# Patient Record
Sex: Female | Born: 1965 | Race: White | Hispanic: No | Marital: Married | Smoking: Never smoker
Health system: Southern US, Community
[De-identification: ages and names within clinical notes are randomized; demographics above are authoritative.]

## PROBLEM LIST (undated history)

## (undated) DIAGNOSIS — E7800 Pure hypercholesterolemia, unspecified: Secondary | ICD-10-CM

## (undated) DIAGNOSIS — U071 COVID-19: Secondary | ICD-10-CM

## (undated) DIAGNOSIS — I10 Essential (primary) hypertension: Secondary | ICD-10-CM

## (undated) HISTORY — DX: Pure hypercholesterolemia, unspecified: E78.00

## (undated) HISTORY — DX: COVID-19: U07.1

## (undated) HISTORY — DX: Essential (primary) hypertension: I10

---

## 2007-05-24 ENCOUNTER — Other Ambulatory Visit: Admission: RE | Admit: 2007-05-24 | Discharge: 2007-05-24 | Payer: Self-pay | Admitting: Gynecology

## 2008-06-09 ENCOUNTER — Other Ambulatory Visit: Admission: RE | Admit: 2008-06-09 | Discharge: 2008-06-09 | Payer: Self-pay | Admitting: Gynecology

## 2009-06-10 ENCOUNTER — Other Ambulatory Visit: Admission: RE | Admit: 2009-06-10 | Discharge: 2009-06-10 | Payer: Self-pay | Admitting: Gynecology

## 2009-06-10 ENCOUNTER — Ambulatory Visit: Payer: Self-pay | Admitting: Gynecology

## 2009-06-10 ENCOUNTER — Encounter: Payer: Self-pay | Admitting: Gynecology

## 2009-06-25 ENCOUNTER — Ambulatory Visit: Payer: Self-pay | Admitting: Gynecology

## 2009-06-29 ENCOUNTER — Encounter: Admission: RE | Admit: 2009-06-29 | Discharge: 2009-06-29 | Payer: Self-pay | Admitting: Gynecology

## 2010-06-13 ENCOUNTER — Ambulatory Visit: Payer: Self-pay | Admitting: Gynecology

## 2010-06-13 ENCOUNTER — Other Ambulatory Visit: Admission: RE | Admit: 2010-06-13 | Discharge: 2010-06-13 | Payer: Self-pay | Admitting: Gynecology

## 2011-06-15 ENCOUNTER — Encounter: Payer: Self-pay | Admitting: Gynecology

## 2011-06-15 ENCOUNTER — Other Ambulatory Visit (HOSPITAL_COMMUNITY)
Admission: RE | Admit: 2011-06-15 | Discharge: 2011-06-15 | Disposition: A | Discharge status: Home / Self Care | Payer: 59 | Source: Ambulatory Visit | Attending: Gynecology | Admitting: Gynecology

## 2011-06-15 ENCOUNTER — Ambulatory Visit (INDEPENDENT_AMBULATORY_CARE_PROVIDER_SITE_OTHER): Payer: 59 | Admitting: Gynecology

## 2011-06-15 VITALS — BP 122/78 | Ht 65.0 in | Wt 166.0 lb

## 2011-06-15 DIAGNOSIS — Z01419 Encounter for gynecological examination (general) (routine) without abnormal findings: Secondary | ICD-10-CM | POA: Insufficient documentation

## 2011-06-15 DIAGNOSIS — Z3041 Encounter for surveillance of contraceptive pills: Secondary | ICD-10-CM

## 2011-06-15 DIAGNOSIS — Z131 Encounter for screening for diabetes mellitus: Secondary | ICD-10-CM

## 2011-06-15 DIAGNOSIS — Z13220 Encounter for screening for lipoid disorders: Secondary | ICD-10-CM

## 2011-06-15 DIAGNOSIS — R823 Hemoglobinuria: Secondary | ICD-10-CM

## 2011-06-15 MED ORDER — LEVONORG-ETH ESTRAD TRIPHASIC PO TABS: 1.0000 | ORAL_TABLET | Freq: Every day | ORAL | Status: DC

## 2011-06-15 NOTE — Progress Notes (Signed)
Mary Li April 03, 1966 [id number]        45 y.o.  for annual exam.  Doing well no complaints.  Past medical history,surgical history, medications, allergies, family history and social history were all reviewed and documented in the EPIC chart. ROS:  Was performed and pertinent positives and negatives are included in the history.  Exam: chaperone present Filed Vitals:   06/15/11 1449  BP: 122/78   General appearance  Normal Skin grossly normal Head/Neck normal with no cervical or supraclavicular adenopathy thyroid normal Lungs  clear Cardiac RR, without RMG Abdominal  soft, nontender, without masses, organomegaly or hernia Breasts  examined lying and sitting without masses, retractions, discharge or axillary adenopathy. Pelvic  Ext/BUS/vagina  normal   Cervix  normal  Pap done  Uterus  anteverted, normal size, shape and contour, midline and mobile nontender   Adnexa  Without masses or tenderness    Anus and perineum  normal   Rectovaginal  normal sphincter tone without palpated masses or tenderness.    Assessment/Plan:  45 y.o. female for annual exam.   Doing well no complaints. She is on Trivora BCPs and wants to continue. She does not smoke is not being followed for any medical issues such as blood pressure or diabetes. I again reviewed the risks to include stroke heart attack DVT all of which she understands and accepts wants to continue I refilled her times a year. Self breast exams on a monthly basis discussed encouraged. She is overdue for her mammography I strongly urged her to schedule this and she agrees to do so. Check baseline labs CBC glucose lipid profile and urinalysis. If significant he is well she'll see me in a year sooner as needed    Dara Lords MD, 3:21 PM 06/15/2011

## 2011-06-16 ENCOUNTER — Telehealth: Payer: Self-pay | Admitting: *Deleted

## 2011-06-16 DIAGNOSIS — E7800 Pure hypercholesterolemia, unspecified: Secondary | ICD-10-CM

## 2011-06-16 NOTE — Telephone Encounter (Signed)
PT INFORMED WITH THE BELOW NOTE. RECALL LETTER AND ORDER FOR FLP IN COMPUTER.

## 2011-06-16 NOTE — Telephone Encounter (Signed)
Message copied by Aura Camps on Fri Jun 16, 2011 11:27 AM ------      Message from: Dara Lords      Created: Fri Jun 16, 2011 11:14 AM       Recommend low-fat diet increased exercise and repeat fasting lipid profile in 6 months.

## 2011-06-17 ENCOUNTER — Other Ambulatory Visit: Payer: Self-pay | Admitting: Gynecology

## 2012-04-04 ENCOUNTER — Ambulatory Visit (INDEPENDENT_AMBULATORY_CARE_PROVIDER_SITE_OTHER): Payer: 59 | Admitting: Gynecology

## 2012-04-04 ENCOUNTER — Encounter: Payer: Self-pay | Admitting: Gynecology

## 2012-04-04 VITALS — BP 128/84

## 2012-04-04 DIAGNOSIS — N390 Urinary tract infection, site not specified: Secondary | ICD-10-CM

## 2012-04-04 DIAGNOSIS — R300 Dysuria: Secondary | ICD-10-CM

## 2012-04-04 LAB — URINALYSIS W MICROSCOPIC + REFLEX CULTURE
Ketones, ur: NEGATIVE mg/dL
Nitrite: NEGATIVE
Urobilinogen, UA: 0.2 mg/dL (ref 0.0–1.0)

## 2012-04-04 MED ORDER — PHENAZOPYRIDINE HCL 200 MG PO TABS: 200.0000 mg | ORAL_TABLET | Freq: Three times a day (TID) | ORAL | Status: AC | PRN

## 2012-04-04 MED ORDER — SULFAMETHOXAZOLE-TRIMETHOPRIM 800-160 MG PO TABS: 1.0000 | ORAL_TABLET | Freq: Two times a day (BID) | ORAL | Status: AC

## 2012-04-04 NOTE — Progress Notes (Signed)
Patient presents with two-day history of increasing frequency, dysuria, lower abdominal discomfort. No fever chills nausea vomiting diarrhea constipation or low back pain.  Exam Spine straight no CVA tenderness. Abdomen soft with mild suprapubic tenderness. No rebound guarding masses or organomegaly.  Assessment and plan: Exam and UA consistent with UTI. We'll treat with Septra DS 1 by mouth twice a day x3 days and Pyridium 200 mg 3 times a day x3 days. Follow up if symptoms persist or recur. Otherwise she will follow up in August when she is due for her annual.

## 2012-04-04 NOTE — Patient Instructions (Signed)
Take antibiotics twice daily for 3 days. Take peridium which will change the color of your urine 3 times daily. Follow up if your symptoms persist or recur. Otherwise follow up in August when you're due for your annual exam.

## 2012-04-07 LAB — URINE CULTURE: Colony Count: >=100000

## 2012-05-16 ENCOUNTER — Other Ambulatory Visit: Payer: Self-pay | Admitting: Gynecology

## 2012-05-20 ENCOUNTER — Other Ambulatory Visit: Payer: Self-pay | Admitting: Gynecology

## 2012-06-16 DIAGNOSIS — E7800 Pure hypercholesterolemia, unspecified: Secondary | ICD-10-CM

## 2012-06-16 HISTORY — DX: Pure hypercholesterolemia, unspecified: E78.00

## 2012-06-19 ENCOUNTER — Encounter: Payer: Self-pay | Admitting: Gynecology

## 2012-06-19 ENCOUNTER — Ambulatory Visit (INDEPENDENT_AMBULATORY_CARE_PROVIDER_SITE_OTHER): Payer: 59 | Admitting: Gynecology

## 2012-06-19 VITALS — BP 120/74 | Ht 65.0 in | Wt 170.0 lb

## 2012-06-19 DIAGNOSIS — Z13220 Encounter for screening for lipoid disorders: Secondary | ICD-10-CM

## 2012-06-19 DIAGNOSIS — Z131 Encounter for screening for diabetes mellitus: Secondary | ICD-10-CM

## 2012-06-19 DIAGNOSIS — Z01419 Encounter for gynecological examination (general) (routine) without abnormal findings: Secondary | ICD-10-CM

## 2012-06-19 LAB — LIPID PANEL
Cholesterol: 263 mg/dL — ABNORMAL HIGH (ref 0–200)
HDL: 49 mg/dL (ref >39)

## 2012-06-19 LAB — CBC WITH DIFFERENTIAL/PLATELET
Eosinophils Absolute: 0.1 10*3/uL (ref 0.0–0.7)
Lymphocytes Relative: 31 % (ref 12–46)
Lymphs Abs: 1.5 10*3/uL (ref 0.7–4.0)
Neutro Abs: 2.6 10*3/uL (ref 1.7–7.7)
Neutrophils Relative %: 53 % (ref 43–77)
Platelets: 257 10*3/uL (ref 150–400)
RBC: 4.83 MIL/uL (ref 3.87–5.11)
WBC: 4.8 10*3/uL (ref 4.0–10.5)

## 2012-06-19 LAB — GLUCOSE, RANDOM: Glucose, Bld: 103 mg/dL — ABNORMAL HIGH (ref 70–99)

## 2012-06-19 MED ORDER — LEVONORG-ETH ESTRAD TRIPHASIC PO TABS: 1.0000 | ORAL_TABLET | Freq: Every day | ORAL | Status: DC

## 2012-06-19 NOTE — Patient Instructions (Signed)
Follow up in one year for annual gynecologic exam. 

## 2012-06-19 NOTE — Progress Notes (Signed)
[facility] 28-Aug-1966 [id number]        46 y.o.  G0P0 for annual exam.  Doing well.  Past medical history,surgical history, medications, allergies, family history and social history were all reviewed and documented in the EPIC chart. ROS:  Was performed and pertinent positives and negatives are included in the history.  Exam: Sherrilyn Rist assistant Filed Vitals:   06/19/12 0859  BP: 120/74  Height: 5\' 5"  (1.651 m)  Weight: 170 lb (77.111 kg)   General appearance  Normal Skin grossly normal Head/Neck normal with no cervical or supraclavicular adenopathy thyroid normal Lungs  clear Cardiac RR, without RMG Abdominal  soft, nontender, without masses, organomegaly or hernia Breasts  examined lying and sitting without masses, retractions, discharge or axillary adenopathy. Pelvic  Ext/BUS/vagina  normal   Cervix  normal slight menses flow  Uterus  anteverted, normal size, shape and contour, midline and mobile nontender   Adnexa  Without masses or tenderness    Anus and perineum  normal   Rectovaginal  normal sphincter tone without palpated masses or tenderness.    Assessment/Plan:  46 y.o. G0P0 female for annual exam.   1. Contraception. Patient on oral contraceptives and wants to continue. She's having no problems. We discussed the risks to include stroke heart attack DVT. She has no medical issues does not smoke. I reviewed alternatives to encourage her to consider Mirena IUD. This is not interested at this point. I refilled her pills times a year. 2. Mammography. Patient's overdue for her mammogram and encouraged her to schedule this. SBE monthly reviewed. 3. Pap smear. Last Pap smear 2012. No Pap smear done today. Patient has no history of abnormal Pap smears with numerous normal records in her chart. We'll plan every 3-5 your screening. 4. Health maintenance. Patient had borderline cholesterol with elevated LDL last year and never repeated. We'll check baseline labs today. She is  fasting. CBC lipid profile glucose urinalysis ordered. Follow up one year, sooner as needed.    Dara Lords MD, 9:23 AM 06/19/2012

## 2012-06-19 NOTE — Addendum Note (Signed)
Addended by: Dara Lords on: 06/19/2012 10:54 AM   Modules accepted: Orders

## 2012-06-20 ENCOUNTER — Encounter: Payer: Self-pay | Admitting: Gynecology

## 2012-06-20 LAB — URINALYSIS W MICROSCOPIC + REFLEX CULTURE
Casts: NONE SEEN
Glucose, UA: NEGATIVE mg/dL
Leukocytes, UA: NEGATIVE
pH: 5.5 (ref 5.0–8.0)

## 2012-06-21 ENCOUNTER — Telehealth: Payer: Self-pay | Admitting: Gynecology

## 2012-06-21 MED ORDER — SULFAMETHOXAZOLE-TRIMETHOPRIM 800-160 MG PO TABS: 1.0000 | ORAL_TABLET | Freq: Two times a day (BID) | ORAL | Status: AC

## 2012-06-21 NOTE — Telephone Encounter (Signed)
Left message on pt voicemail to call 

## 2012-06-21 NOTE — Telephone Encounter (Signed)
Tell patient urine shows UTI. Recommend Septra DS 1 by mouth twice a day x3 days. Recommend follow up urine analysis after treatment in several weeks just to make sure the blood in her urine clears.

## 2012-06-22 LAB — URINE CULTURE: Colony Count: >=100000

## 2012-06-24 NOTE — Telephone Encounter (Signed)
Pt informed with the below note. # given to [place] to find PCP.

## 2012-06-24 NOTE — Telephone Encounter (Signed)
Pt informed regarding the below note. I gave her other lab work as well, she asked if her glucose should be recheck? She was fasting that day. Glucose was 103. Please advise

## 2012-06-24 NOTE — Telephone Encounter (Signed)
The patient definitely needs to follow up with her primary physician in reference to her elevated cholesterol and LDL. They will be rechecking this at some point and they can recheck a glucose at that point. Less than 100 is normal, 100-125 is considered prediabetic and 126 above his diabetes. So at 103 it is a marginal elevation and I wouldn't do anything else other than worry about her cholesterol this point.

## 2012-07-04 NOTE — Telephone Encounter (Signed)
Order expired the pt has had her annual this year already. KW

## 2013-05-19 ENCOUNTER — Other Ambulatory Visit: Payer: Self-pay | Admitting: Gynecology

## 2013-06-20 ENCOUNTER — Encounter: Payer: Self-pay | Admitting: Gynecology

## 2013-06-20 ENCOUNTER — Ambulatory Visit (INDEPENDENT_AMBULATORY_CARE_PROVIDER_SITE_OTHER): Payer: 59 | Admitting: Gynecology

## 2013-06-20 VITALS — BP 120/76 | Ht 66.0 in | Wt 174.0 lb

## 2013-06-20 DIAGNOSIS — Z01419 Encounter for gynecological examination (general) (routine) without abnormal findings: Secondary | ICD-10-CM

## 2013-06-20 DIAGNOSIS — Z13220 Encounter for screening for lipoid disorders: Secondary | ICD-10-CM

## 2013-06-20 LAB — LIPID PANEL
HDL: 44 mg/dL (ref >39)
LDL Cholesterol: 183 mg/dL — ABNORMAL HIGH (ref 0–99)
Triglycerides: 102 mg/dL (ref <150)
VLDL: 20 mg/dL (ref 0–40)

## 2013-06-20 LAB — COMPREHENSIVE METABOLIC PANEL
BUN: 13 mg/dL (ref 6–23)
CO2: 25 mEq/L (ref 19–32)
Calcium: 8.8 mg/dL (ref 8.4–10.5)
Chloride: 107 mEq/L (ref 96–112)
Creat: 0.67 mg/dL (ref 0.50–1.10)

## 2013-06-20 LAB — CBC WITH DIFFERENTIAL/PLATELET
HCT: 41.2 % (ref 36.0–46.0)
Hemoglobin: 13.7 g/dL (ref 12.0–15.0)
Lymphocytes Relative: 29 % (ref 12–46)
Monocytes Absolute: 0.4 10*3/uL (ref 0.1–1.0)
Monocytes Relative: 9 % (ref 3–12)
Neutro Abs: 3.1 10*3/uL (ref 1.7–7.7)
WBC: 5.1 10*3/uL (ref 4.0–10.5)

## 2013-06-20 MED ORDER — LEVONORG-ETH ESTRAD TRIPHASIC PO TABS: ORAL_TABLET | ORAL | Status: DC

## 2013-06-20 NOTE — Patient Instructions (Addendum)
Call to Schedule your mammogram  Facilities in [place]: 1)  [facility], [address] Phone: [phone] 2)  [facility], [address] Phone: [phone] 3)  Dr. Bertrand at [facility]  [address] Phone: [phone]     Mammogram A mammogram is an X-ray test to find changes in a woman's breast. You should get a mammogram if:  You are 40 years of age or older  You have risk factors.   Your doctor recommends that you have one.  BEFORE THE TEST  Do not schedule the test the week before your period, especially if your breasts are sore during this time.  On the day of your mammogram:  Wash your breasts and armpits well. After washing, do not put on any deodorant or talcum powder on until after your test.   Eat and drink as you usually do.   Take your medicines as usual.   If you are diabetic and take insulin, make sure you:   Eat before coming for your test.   Take your insulin as usual.   If you cannot keep your appointment, call before the appointment to cancel. Schedule another appointment.  TEST  You will need to undress from the waist up. You will put on a hospital gown.   Your breast will be put on the mammogram machine, and it will press firmly on your breast with a piece of plastic called a compression paddle. This will make your breast flatter so that the machine can X-ray all parts of your breast.   Both breasts will be X-rayed. Each breast will be X-rayed from above and from the side. An X-ray might need to be taken again if the picture is not good enough.   The mammogram will last about 15 to 30 minutes.  AFTER THE TEST Finding out the results of your test Ask when your test results will be ready. Make sure you get your test results.  Document Released: 12/29/2008 Document Revised: 09/21/2011 Document Reviewed: 12/29/2008 Exit[employer] Patient  Information 2012 [employer].   

## 2013-06-20 NOTE — Progress Notes (Signed)
[facility] [birth year] [id number]        47 y.o.  G0P0 for annual exam.  Several issues noted below.  Past medical history,surgical history, medications, allergies, family history and social history were all reviewed and documented in the EPIC chart.  ROS:  Performed and pertinent positives and negatives are included in the history, assessment and plan .  Exam: Kim assistant Filed Vitals:   06/20/13 0955  BP: 120/76  Height: 5\' 6"  (1.676 m)  Weight: 174 lb (78.926 kg)   General appearance  Normal Skin grossly normal Head/Neck normal with no cervical or supraclavicular adenopathy thyroid normal Lungs  clear Cardiac RR, without RMG Abdominal  soft, nontender, without masses, organomegaly or hernia Breasts  examined lying and sitting without masses, retractions, discharge or axillary adenopathy. Pelvic  Ext/BUS/vagina  normal  Cervix  normal  Uterus  anteverted, normal size, shape and contour, midline and mobile nontender   Adnexa  Without masses or tenderness    Anus and perineum  normal   Rectovaginal  normal sphincter tone without palpated masses or tenderness.    Assessment/Plan:  47 y.o. G0P0 female for annual exam, regular menses, oral contraceptives.   1. Contraceptive management. Patient continues on low dose oral contraceptives. She's doing well and wants to continue. I again reviewed the risks to include increased risk of thrombosis possibly higher as she is getting older. Stroke heart attack DVT all discussed. Patient understands and accepts I refilled her x1 year. 2. Hypercholesterolemia. Elevated last year with recommendation to followup with primary. Patient never did this. I again strongly encouraged her to make an appointment she agrees to do so and will call [facility] to arrange this. Will go ahead and check lipid profile now. 3. Pap smear 2012. No Pap smear done today. No history of abnormal Pap smears previously. Plan repeat Pap smear next year 3 year  interval. 4. Mammography 2010. Strongly urged patient to schedule mammogram now she agrees to do so. SBE monthly reviewed. 5. Health maintenance. Baseline CBC comprehensive metabolic panel lipid profile urinalysis ordered. Followup one year, sooner as needed.  Note: This document was prepared with digital dictation and possible smart phrase technology. Any transcriptional errors that result from this process are unintentional.   Dara Lords MD, 10:15 AM 06/20/2013

## 2013-06-21 LAB — URINALYSIS W MICROSCOPIC + REFLEX CULTURE
Crystals: NONE SEEN
Ketones, ur: NEGATIVE mg/dL
pH: 6 (ref 5.0–8.0)

## 2013-06-23 LAB — URINE CULTURE

## 2013-06-24 ENCOUNTER — Other Ambulatory Visit: Payer: Self-pay | Admitting: Gynecology

## 2013-06-24 MED ORDER — SULFAMETHOXAZOLE-TMP DS 800-160 MG PO TABS: 1.0000 | ORAL_TABLET | Freq: Two times a day (BID) | ORAL | Status: DC

## 2014-03-17 ENCOUNTER — Ambulatory Visit (INDEPENDENT_AMBULATORY_CARE_PROVIDER_SITE_OTHER): Payer: 59 | Admitting: Women's Health

## 2014-03-17 DIAGNOSIS — R309 Painful micturition, unspecified: Secondary | ICD-10-CM

## 2014-03-17 DIAGNOSIS — N390 Urinary tract infection, site not specified: Secondary | ICD-10-CM

## 2014-03-17 DIAGNOSIS — R300 Dysuria: Secondary | ICD-10-CM

## 2014-03-17 DIAGNOSIS — R350 Frequency of micturition: Secondary | ICD-10-CM

## 2014-03-17 LAB — URINALYSIS W MICROSCOPIC + REFLEX CULTURE
BILIRUBIN URINE: NEGATIVE
Casts: NONE SEEN
Crystals: NONE SEEN
Glucose, UA: NEGATIVE mg/dL
KETONES UR: NEGATIVE mg/dL
Nitrite: POSITIVE — AB
PROTEIN: NEGATIVE mg/dL
Specific Gravity, Urine: 1.025 (ref 1.005–1.030)
UROBILINOGEN UA: 0.2 mg/dL (ref 0.0–1.0)
pH: 6 (ref 5.0–8.0)

## 2014-03-17 MED ORDER — LEVONORG-ETH ESTRAD TRIPHASIC PO TABS: ORAL_TABLET | ORAL | Status: DC

## 2014-03-17 MED ORDER — SULFAMETHOXAZOLE-TMP DS 800-160 MG PO TABS: 1.0000 | ORAL_TABLET | Freq: Two times a day (BID) | ORAL | Status: DC

## 2014-03-17 NOTE — Patient Instructions (Signed)
Urinary Tract Infection  Urinary tract infections (UTIs) can develop anywhere along your urinary tract. Your urinary tract is your body's drainage system for removing wastes and extra water. Your urinary tract includes two kidneys, two ureters, a bladder, and a urethra. Your kidneys are a pair of bean-shaped organs. Each kidney is about the size of your fist. They are located below your ribs, one on each side of your spine.  CAUSES  Infections are caused by microbes, which are microscopic organisms, including fungi, viruses, and bacteria. These organisms are so small that they can only be seen through a microscope. Bacteria are the microbes that most commonly cause UTIs.  SYMPTOMS   Symptoms of UTIs may vary by age and gender of the patient and by the location of the infection. Symptoms in young women typically include a frequent and intense urge to urinate and a painful, burning feeling in the bladder or urethra during urination. Older women and men are more likely to be tired, shaky, and weak and have muscle aches and abdominal pain. A fever may mean the infection is in your kidneys. Other symptoms of a kidney infection include pain in your back or sides below the ribs, nausea, and vomiting.  DIAGNOSIS  To diagnose a UTI, your caregiver will ask you about your symptoms. Your caregiver also will ask to provide a urine sample. The urine sample will be tested for bacteria and white blood cells. White blood cells are made by your body to help fight infection.  TREATMENT   Typically, UTIs can be treated with medication. Because most UTIs are caused by a bacterial infection, they usually can be treated with the use of antibiotics. The choice of antibiotic and length of treatment depend on your symptoms and the type of bacteria causing your infection.  HOME CARE INSTRUCTIONS   If you were prescribed antibiotics, take them exactly as your caregiver instructs you. Finish the medication even if you feel better after you  have only taken some of the medication.   Drink enough water and fluids to keep your urine clear or pale yellow.   Avoid caffeine, tea, and carbonated beverages. They tend to irritate your bladder.   Empty your bladder often. Avoid holding urine for long periods of time.   Empty your bladder before and after sexual intercourse.   After a bowel movement, women should cleanse from front to back. Use each tissue only once.  SEEK MEDICAL CARE IF:    You have back pain.   You develop a fever.   Your symptoms do not begin to resolve within 3 days.  SEEK IMMEDIATE MEDICAL CARE IF:    You have severe back pain or lower abdominal pain.   You develop chills.   You have nausea or vomiting.   You have continued burning or discomfort with urination.  MAKE SURE YOU:    Understand these instructions.   Will watch your condition.   Will get help right away if you are not doing well or get worse.  Document Released: 07/12/2005 Document Revised: 04/02/2012 Document Reviewed: 11/10/2011  Exit[employer] Patient Information 2014 [employer].

## 2014-03-17 NOTE — Progress Notes (Signed)
Patient ID: Mary Li, female   DOB: 04-01-1966, 48 y.o.   MRN: [id number] Presents with complaint of urinary frequency with urgency, pain, slight discomfort at end of stream of urination for 1 week,  tied AZO with minimal relief. Denies vaginal discharge, abdominal pain or fever. Monthly cycle on trivora.  Exam: Appears well. UA: Small leukocytes, positive nitrites, 1-50 WBCs, 11-20 rbc's,  UTI  Plan: Septra DS twice daily for 3 days, prescription, proper use given and reviewed. Urine culture pending. Instructed to call if no relief of symptoms.

## 2014-03-20 LAB — URINE CULTURE

## 2014-06-23 ENCOUNTER — Encounter: Payer: Self-pay | Admitting: Gynecology

## 2014-06-23 ENCOUNTER — Other Ambulatory Visit (HOSPITAL_COMMUNITY)
Admission: RE | Admit: 2014-06-23 | Discharge: 2014-06-23 | Disposition: A | Discharge status: Home / Self Care | Payer: 59 | Source: Ambulatory Visit | Attending: Gynecology | Admitting: Gynecology

## 2014-06-23 ENCOUNTER — Encounter: Payer: 59 | Admitting: Gynecology

## 2014-06-23 ENCOUNTER — Ambulatory Visit (INDEPENDENT_AMBULATORY_CARE_PROVIDER_SITE_OTHER): Payer: 59 | Admitting: Gynecology

## 2014-06-23 VITALS — BP 112/72 | Ht 65.0 in | Wt 170.8 lb

## 2014-06-23 DIAGNOSIS — Z1151 Encounter for screening for human papillomavirus (HPV): Secondary | ICD-10-CM | POA: Diagnosis present

## 2014-06-23 DIAGNOSIS — Z01419 Encounter for gynecological examination (general) (routine) without abnormal findings: Secondary | ICD-10-CM

## 2014-06-23 LAB — CBC WITH DIFFERENTIAL/PLATELET
BASOS ABS: 0.1 10*3/uL (ref 0.0–0.1)
BASOS PCT: 1 % (ref 0–1)
EOS ABS: 0.1 10*3/uL (ref 0.0–0.7)
Eosinophils Relative: 2 % (ref 0–5)
HCT: 42 % (ref 36.0–46.0)
Hemoglobin: 14.3 g/dL (ref 12.0–15.0)
Lymphocytes Relative: 23 % (ref 12–46)
Lymphs Abs: 1.5 10*3/uL (ref 0.7–4.0)
MCH: 28.9 pg (ref 26.0–34.0)
MCHC: 34 g/dL (ref 30.0–36.0)
MCV: 84.8 fL (ref 78.0–100.0)
Monocytes Absolute: 0.6 10*3/uL (ref 0.1–1.0)
Monocytes Relative: 9 % (ref 3–12)
NEUTROS ABS: 4.2 10*3/uL (ref 1.7–7.7)
NEUTROS PCT: 65 % (ref 43–77)
PLATELETS: 253 10*3/uL (ref 150–400)
RBC: 4.95 MIL/uL (ref 3.87–5.11)
RDW: 13.8 % (ref 11.5–15.5)
WBC: 6.5 10*3/uL (ref 4.0–10.5)

## 2014-06-23 MED ORDER — LEVONORG-ETH ESTRAD TRIPHASIC PO TABS: ORAL_TABLET | ORAL | Status: DC

## 2014-06-23 NOTE — Patient Instructions (Signed)
Call to Schedule your mammogram  Facilities in [place]: 1)  [facility], [address] Phone: [phone] 2)  [facility]. [address] [place] Phone: [phone] 3)  Dr. Isaiah Blakes at [facility] [address] Phone: [phone]     Mammogram A mammogram is an X-ray test to find changes in a woman's breast. You should get a mammogram if:  You are 49 years of age or older  You have risk factors.   Your doctor recommends that you have one.  BEFORE THE TEST  Do not schedule the test the week before your period, especially if your breasts are sore during this time.  On the day of your mammogram:  Wash your breasts and armpits well. After washing, do not put on any deodorant or talcum powder on until after your test.   Eat and drink as you usually do.   Take your medicines as usual.   If you are diabetic and take insulin, make sure you:   Eat before coming for your test.   Take your insulin as usual.   If you cannot keep your appointment, call before the appointment to cancel. Schedule another appointment.  TEST  You will need to undress from the waist up. You will put on a hospital gown.   Your breast will be put on the mammogram machine, and it will press firmly on your breast with a piece of plastic called a compression paddle. This will make your breast flatter so that the machine can X-ray all parts of your breast.   Both breasts will be X-rayed. Each breast will be X-rayed from above and from the side. An X-ray might need to be taken again if the picture is not good enough.   The mammogram will last about 15 to 30 minutes.  AFTER THE TEST Finding out the results of your test Ask when your test results will be ready. Make sure you get your test results.  Document Released: 12/29/2008 Document Revised: 09/21/2011 Document Reviewed: 12/29/2008 [facility] Patient  Information 2012 [place].  You may obtain a copy of any labs that were done today by logging onto MyChart as outlined in the instructions provided with your AVS (after visit summary). The office will not call with normal lab results but certainly if there are any significant abnormalities then we will contact you.   Health Maintenance, Female A healthy lifestyle and preventative care can promote health and wellness.  Maintain regular health, dental, and eye exams.  Eat a healthy diet. Foods like vegetables, fruits, whole grains, low-fat dairy products, and lean protein foods contain the nutrients you need without too many calories. Decrease your intake of foods high in solid fats, added sugars, and salt. Get information about a proper diet from your caregiver, if necessary.  Regular physical exercise is one of the most important things you can do for your health. Most adults should get at least 150 minutes of moderate-intensity exercise (any activity that increases your heart rate and causes you to sweat) each week. In addition, most adults need muscle-strengthening exercises on 2 or more days a week.   Maintain a healthy weight. The body mass index (BMI) is a screening tool to identify possible weight problems. It provides an estimate of body fat based on height and weight. Your caregiver can help determine your BMI, and can help you achieve or maintain a healthy weight. For adults  20 years and older:  A BMI below 18.5 is considered underweight.  A BMI of 18.5 to 24.9 is normal.  A BMI of 25 to 29.9 is considered overweight.  A BMI of 30 and above is considered obese.  Maintain normal blood lipids and cholesterol by exercising and minimizing your intake of saturated fat. Eat a balanced diet with plenty of fruits and vegetables. Blood tests for lipids and cholesterol should begin at age 20 and be repeated every 5 years. If your lipid or cholesterol levels are high, you are over 50, or  you are a high risk for heart disease, you may need your cholesterol levels checked more frequently.Ongoing high lipid and cholesterol levels should be treated with medicines if diet and exercise are not effective.  If you smoke, find out from your caregiver how to quit. If you do not use tobacco, do not start.  Lung cancer screening is recommended for adults aged 55 80 years who are at high risk for developing lung cancer because of a history of smoking. Yearly low-dose computed tomography (CT) is recommended for people who have at least a 30-pack-year history of smoking and are a current smoker or have quit within the past 15 years. A pack year of smoking is smoking an average of 1 pack of cigarettes a day for 1 year (for example: 1 pack a day for 30 years or 2 packs a day for 15 years). Yearly screening should continue until the smoker has stopped smoking for at least 15 years. Yearly screening should also be stopped for people who develop a health problem that would prevent them from having lung cancer treatment.  If you are pregnant, do not drink alcohol. If you are breastfeeding, be very cautious about drinking alcohol. If you are not pregnant and choose to drink alcohol, do not exceed 1 drink per day. One drink is considered to be 12 ounces (355 mL) of beer, 5 ounces (148 mL) of wine, or 1.5 ounces (44 mL) of liquor.  Avoid use of street drugs. Do not share needles with anyone. Ask for help if you need support or instructions about stopping the use of drugs.  High blood pressure causes heart disease and increases the risk of stroke. Blood pressure should be checked at least every 1 to 2 years. Ongoing high blood pressure should be treated with medicines, if weight loss and exercise are not effective.  If you are 55 to 49 years old, ask your caregiver if you should take aspirin to prevent strokes.  Diabetes screening involves taking a blood sample to check your fasting blood sugar level. This  should be done once every 3 years, after age 45, if you are within normal weight and without risk factors for diabetes. Testing should be considered at a younger age or be carried out more frequently if you are overweight and have at least 1 risk factor for diabetes.  Breast cancer screening is essential preventative care for women. You should practice "breast self-awareness." This means understanding the normal appearance and feel of your breasts and may include breast self-examination. Any changes detected, no matter how small, should be reported to a caregiver. Women in their 20s and 30s should have a clinical breast exam (CBE) by a caregiver as part of a regular health exam every 1 to 3 years. After age 40, women should have a CBE every year. Starting at age 40, women should consider having a mammogram (breast X-ray) every year. Women who have a   family history of breast cancer should talk to their caregiver about genetic screening. Women at a high risk of breast cancer should talk to their caregiver about having an MRI and a mammogram every year.  Breast cancer gene (BRCA)-related cancer risk assessment is recommended for women who have family members with BRCA-related cancers. BRCA-related cancers include breast, ovarian, tubal, and peritoneal cancers. Having family members with these cancers may be associated with an increased risk for harmful changes (mutations) in the breast cancer genes BRCA1 and BRCA2. Results of the assessment will determine the need for genetic counseling and BRCA1 and BRCA2 testing.  The Pap test is a screening test for cervical cancer. Women should have a Pap test starting at age 33. Between ages 75 and 14, Pap tests should be repeated every 2 years. Beginning at age 4, you should have a Pap test every 3 years as long as the past 3 Pap tests have been normal. If you had a hysterectomy for a problem that was not cancer or a condition that could lead to cancer, then you no longer  need Pap tests. If you are between ages 72 and 12, and you have had normal Pap tests going back 10 years, you no longer need Pap tests. If you have had past treatment for cervical cancer or a condition that could lead to cancer, you need Pap tests and screening for cancer for at least 20 years after your treatment. If Pap tests have been discontinued, risk factors (such as a new sexual partner) need to be reassessed to determine if screening should be resumed. Some women have medical problems that increase the chance of getting cervical cancer. In these cases, your caregiver may recommend more frequent screening and Pap tests.  The human papillomavirus (HPV) test is an additional test that may be used for cervical cancer screening. The HPV test looks for the virus that can cause the cell changes on the cervix. The cells collected during the Pap test can be tested for HPV. The HPV test could be used to screen women aged 84 years and older, and should be used in women of any age who have unclear Pap test results. After the age of 73, women should have HPV testing at the same frequency as a Pap test.  Colorectal cancer can be detected and often prevented. Most routine colorectal cancer screening begins at the age of 56 and continues through age 76. However, your caregiver may recommend screening at an earlier age if you have risk factors for colon cancer. On a yearly basis, your caregiver may provide home test kits to check for hidden blood in the stool. Use of a small camera at the end of a tube, to directly examine the colon (sigmoidoscopy or colonoscopy), can detect the earliest forms of colorectal cancer. Talk to your caregiver about this at age 20, when routine screening begins. Direct examination of the colon should be repeated every 5 to 10 years through age 83, unless early forms of pre-cancerous polyps or small growths are found.  Hepatitis C blood testing is recommended for all people born from [birth year]  through 1965 and any individual with known risks for hepatitis C.  Practice safe sex. Use condoms and avoid high-risk sexual practices to reduce the spread of sexually transmitted infections (STIs). Sexually active women aged 28 and younger should be checked for Chlamydia, which is a common sexually transmitted infection. Older women with new or multiple partners should also be tested for Chlamydia. Testing for  other STIs is recommended if you are sexually active and at increased risk.  Osteoporosis is a disease in which the bones lose minerals and strength with aging. This can result in serious bone fractures. The risk of osteoporosis can be identified using a bone density scan. Women ages 35 and over and women at risk for fractures or osteoporosis should discuss screening with their caregivers. Ask your caregiver whether you should be taking a calcium supplement or vitamin D to reduce the rate of osteoporosis.  Menopause can be associated with physical symptoms and risks. Hormone replacement therapy is available to decrease symptoms and risks. You should talk to your caregiver about whether hormone replacement therapy is right for you.  Use sunscreen. Apply sunscreen liberally and repeatedly throughout the day. You should seek shade when your shadow is shorter than you. Protect yourself by wearing long sleeves, pants, a wide-brimmed hat, and sunglasses year round, whenever you are outdoors.  Notify your caregiver of new moles or changes in moles, especially if there is a change in shape or color. Also notify your caregiver if a mole is larger than the size of a pencil eraser.  Stay current with your immunizations. Document Released: 04/17/2011 Document Revised: 01/27/2013 Document Reviewed: 04/17/2011 [facility] Patient Information 2014 [place].

## 2014-06-23 NOTE — Progress Notes (Signed)
[facility] [birth year] [id number]        48 y.o.  G0P0 for annual exam.  Doing well.  Past medical history,surgical history, problem list, medications, allergies, family history and social history were all reviewed and documented as reviewed in the EPIC chart.  ROS:  12 system ROS performed with pertinent positives and negatives included in the history, assessment and plan.   Additional significant findings :  none   Exam: [employer] Vitals:   06/23/14 1038  BP: 112/72  Height: $Remove'5\' 5"'[id number]$  (1.651 m)  Weight: 170 lb 12.8 oz (77.474 kg)   General appearance:  Normal affect, orientation and appearance. Skin: Grossly normal HEENT: Without gross lesions.  No cervical or supraclavicular adenopathy. Thyroid normal.  Lungs:  Clear without wheezing, rales or rhonchi Cardiac: RR, without RMG Abdominal:  Soft, nontender, without masses, guarding, rebound, organomegaly or hernia Breasts:  Examined lying and sitting without masses, retractions, discharge or axillary adenopathy. Pelvic:  Ext/BUS/vagina normal  Cervix normal.  Pap/HPV  Uterus anteverted, normal size, shape and contour, midline and mobile nontender   Adnexa  Without masses or tenderness    Anus and perineum  Normal   Rectovaginal  Normal sphincter tone without palpated masses or tenderness.    Assessment/Plan:  48 y.o. G0P0 female for annual exam, regular menses, oral contraceptives.   1. Oral contraceptives.  Patient doing well on her oral contraceptives and wants to continue. I reviewed the risks to include thrombosis such as stroke heart attack DVT.  Not being followed for vascular disease in never smoked. Patient understands and accepts the risks and I refilled her Trivora equivalent x1 year 2. Pap 2012.  Pap/HPV today.  No History of significantly abnormal paps 3. Mammogram 2010.  I again strongly recommended several times during today's visit screening mammogram.  Benefits of early detection discussed.  Patient  promises to schedule.  SBE monthly reviewed. 4. Hypercholesterolemia.  Never followed up with a primary MD as recommended.  I again recommended she followup and have this followed by a primary MD.  Will go ahead and recheck her lipid profile today. Regardless the patient knows that I want her to follow up with a primary physician to manage. 5. Health Maintenance.  CBC, Comp Met Panel, Lipid proflie, TSH, UA.  Follow up in one year, sooner as needed.   Note: This document was prepared with digital dictation and possible smart phrase technology. Any transcriptional errors that result from this process are unintentional.   Anastasio Auerbach MD, 11:01 AM 06/23/2014

## 2014-06-24 LAB — URINALYSIS W MICROSCOPIC + REFLEX CULTURE
BACTERIA UA: NONE SEEN
Bilirubin Urine: NEGATIVE
CASTS: NONE SEEN
Crystals: NONE SEEN
Glucose, UA: NEGATIVE mg/dL
KETONES UR: NEGATIVE mg/dL
LEUKOCYTES UA: NEGATIVE
NITRITE: NEGATIVE
PH: 5.5 (ref 5.0–8.0)
Protein, ur: NEGATIVE mg/dL
SPECIFIC GRAVITY, URINE: 1.026 (ref 1.005–1.030)
UROBILINOGEN UA: 0.2 mg/dL (ref 0.0–1.0)

## 2014-06-24 LAB — CYTOLOGY - PAP

## 2014-06-24 LAB — COMPREHENSIVE METABOLIC PANEL
ALBUMIN: 4.2 g/dL (ref 3.5–5.2)
ALK PHOS: 44 U/L (ref 39–117)
ALT: 15 U/L (ref 0–35)
AST: 15 U/L (ref 0–37)
BILIRUBIN TOTAL: 0.8 mg/dL (ref 0.2–1.2)
BUN: 13 mg/dL (ref 6–23)
CO2: 25 mEq/L (ref 19–32)
Calcium: 9.5 mg/dL (ref 8.4–10.5)
Chloride: 106 mEq/L (ref 96–112)
Creat: 0.68 mg/dL (ref 0.50–1.10)
Glucose, Bld: 91 mg/dL (ref 70–99)
POTASSIUM: 4.2 meq/L (ref 3.5–5.3)
SODIUM: 139 meq/L (ref 135–145)
TOTAL PROTEIN: 7 g/dL (ref 6.0–8.3)

## 2014-06-24 LAB — LIPID PANEL
Cholesterol: 207 mg/dL — ABNORMAL HIGH (ref 0–200)
HDL: 50 mg/dL (ref >39)
LDL CALC: 133 mg/dL — AB (ref 0–99)
TRIGLYCERIDES: 118 mg/dL (ref <150)
Total CHOL/HDL Ratio: 4.1 Ratio
VLDL: 24 mg/dL (ref 0–40)

## 2014-06-24 LAB — TSH: TSH: 0.838 u[IU]/mL (ref 0.350–4.500)

## 2015-06-07 ENCOUNTER — Other Ambulatory Visit: Payer: Self-pay | Admitting: Family Medicine

## 2015-06-07 DIAGNOSIS — Z1231 Encounter for screening mammogram for malignant neoplasm of breast: Secondary | ICD-10-CM

## 2015-07-16 ENCOUNTER — Ambulatory Visit (INDEPENDENT_AMBULATORY_CARE_PROVIDER_SITE_OTHER): Payer: Commercial Managed Care - HMO | Admitting: Gynecology

## 2015-07-16 ENCOUNTER — Encounter: Payer: Self-pay | Admitting: Gynecology

## 2015-07-16 VITALS — BP 116/74 | Ht 65.0 in | Wt 178.0 lb

## 2015-07-16 DIAGNOSIS — Z01419 Encounter for gynecological examination (general) (routine) without abnormal findings: Secondary | ICD-10-CM | POA: Diagnosis not present

## 2015-07-16 MED ORDER — LEVONORG-ETH ESTRAD TRIPHASIC PO TABS: ORAL_TABLET | ORAL | Status: DC

## 2015-07-16 NOTE — Progress Notes (Signed)
[facility] [birth year] [id number]        49 y.o.  G0P0 for annual exam.  Doing well without complaints.  Past medical history,surgical history, problem list, medications, allergies, family history and social history were all reviewed and documented as reviewed in the EPIC chart.  ROS:  Performed with pertinent positives and negatives included in the history, assessment and plan.   Additional significant findings :  none   Exam: [facility] Vitals:   07/16/15 1411  BP: 116/74  Height:  (1.651 m)  Weight: 178 lb (80.74 kg)   General appearance:  Normal affect, orientation and appearance. Skin: Grossly normal HEENT: Without gross lesions.  No cervical or supraclavicular adenopathy. Thyroid normal.  Lungs:  Clear without wheezing, rales or rhonchi Cardiac: RR, without RMG Abdominal:  Soft, nontender, without masses, guarding, rebound, organomegaly or hernia Breasts:  Examined lying and sitting without masses, retractions, discharge or axillary adenopathy. Pelvic:  Ext/BUS/vagina normal with menses flow  Cervix normal with menses flow  Uterus anteverted, normal size, shape and contour, midline and mobile nontender   Adnexa  Without masses or tenderness    Anus and perineum  Normal   Rectovaginal  Normal sphincter tone without palpated masses or tenderness.    Assessment/Plan:  49 y.o. G0P0 female for annual exam with regular menses, oral contraceptives.   1. Oral contraceptives. Patient continues on Trivora equivalent. Doing well wants to continue.  Never smoked. Just recently started on cholesterol medication. Risks to include stroke heart attack DVT reviewed. Patient was to continue and I refilled her 1 year. 2. Mammography scheduled next week. SBE monthly reviewed. 3. Pap smear/HPV 2015 negative. No Pap smear done today. No history of significant abnormal Pap smears. Plan repeat at 3-5 year interval. 4. Health maintenance. No lab work done as patient recently had  this done at her primary physician's office who started her on the cholesterol medication. Follow up in one year, sooner as needed.   Dara Lords MD, 2:28 PM 07/16/2015

## 2015-07-16 NOTE — Patient Instructions (Signed)

## 2015-07-20 ENCOUNTER — Other Ambulatory Visit: Payer: Self-pay | Admitting: Gynecology

## 2015-07-27 ENCOUNTER — Ambulatory Visit
Admission: RE | Admit: 2015-07-27 | Discharge: 2015-07-27 | Disposition: A | Discharge status: Home / Self Care | Payer: Commercial Managed Care - HMO | Source: Ambulatory Visit | Attending: Family Medicine | Admitting: Family Medicine

## 2015-07-27 DIAGNOSIS — Z1231 Encounter for screening mammogram for malignant neoplasm of breast: Secondary | ICD-10-CM

## 2015-07-28 ENCOUNTER — Other Ambulatory Visit: Payer: Self-pay | Admitting: Family Medicine

## 2015-07-28 DIAGNOSIS — R928 Other abnormal and inconclusive findings on diagnostic imaging of breast: Secondary | ICD-10-CM

## 2015-08-17 ENCOUNTER — Ambulatory Visit
Admission: RE | Admit: 2015-08-17 | Discharge: 2015-08-17 | Disposition: A | Discharge status: Home / Self Care | Payer: Commercial Managed Care - HMO | Source: Ambulatory Visit | Attending: Family Medicine | Admitting: Family Medicine

## 2015-08-17 DIAGNOSIS — R928 Other abnormal and inconclusive findings on diagnostic imaging of breast: Secondary | ICD-10-CM

## 2015-12-20 ENCOUNTER — Encounter (HOSPITAL_COMMUNITY): Payer: Self-pay | Admitting: Emergency Medicine

## 2015-12-20 ENCOUNTER — Emergency Department (HOSPITAL_COMMUNITY): Payer: Commercial Managed Care - HMO

## 2015-12-20 ENCOUNTER — Emergency Department (HOSPITAL_COMMUNITY)
Admission: EM | Admit: 2015-12-20 | Discharge: 2015-12-20 | Disposition: A | Discharge status: Home / Self Care | Payer: Commercial Managed Care - HMO | Attending: Emergency Medicine | Admitting: Emergency Medicine

## 2015-12-20 DIAGNOSIS — Z7982 Long term (current) use of aspirin: Secondary | ICD-10-CM | POA: Diagnosis not present

## 2015-12-20 DIAGNOSIS — E7800 Pure hypercholesterolemia, unspecified: Secondary | ICD-10-CM | POA: Insufficient documentation

## 2015-12-20 DIAGNOSIS — R1011 Right upper quadrant pain: Secondary | ICD-10-CM | POA: Diagnosis present

## 2015-12-20 DIAGNOSIS — Z79899 Other long term (current) drug therapy: Secondary | ICD-10-CM | POA: Diagnosis not present

## 2015-12-20 DIAGNOSIS — Z3202 Encounter for pregnancy test, result negative: Secondary | ICD-10-CM | POA: Diagnosis not present

## 2015-12-20 DIAGNOSIS — K2970 Gastritis, unspecified, without bleeding: Secondary | ICD-10-CM | POA: Insufficient documentation

## 2015-12-20 LAB — URINALYSIS, ROUTINE W REFLEX MICROSCOPIC
BILIRUBIN URINE: NEGATIVE
Glucose, UA: NEGATIVE mg/dL
KETONES UR: NEGATIVE mg/dL
NITRITE: NEGATIVE
PH: 6.5 (ref 5.0–8.0)
Protein, ur: NEGATIVE mg/dL
SPECIFIC GRAVITY, URINE: 1.023 (ref 1.005–1.030)

## 2015-12-20 LAB — URINE MICROSCOPIC-ADD ON

## 2015-12-20 LAB — CBC
HCT: 41.9 % (ref 36.0–46.0)
HEMOGLOBIN: 13.8 g/dL (ref 12.0–15.0)
MCH: 29.1 pg (ref 26.0–34.0)
MCHC: 32.9 g/dL (ref 30.0–36.0)
MCV: 88.4 fL (ref 78.0–100.0)
Platelets: 251 10*3/uL (ref 150–400)
RBC: 4.74 MIL/uL (ref 3.87–5.11)
RDW: 13.1 % (ref 11.5–15.5)
WBC: 9.5 10*3/uL (ref 4.0–10.5)

## 2015-12-20 LAB — COMPREHENSIVE METABOLIC PANEL
ALK PHOS: 40 U/L (ref 38–126)
ALT: 23 U/L (ref 14–54)
ANION GAP: 10 (ref 5–15)
AST: 24 U/L (ref 15–41)
Albumin: 4.2 g/dL (ref 3.5–5.0)
BILIRUBIN TOTAL: 1.1 mg/dL (ref 0.3–1.2)
BUN: 11 mg/dL (ref 6–20)
CALCIUM: 9.2 mg/dL (ref 8.9–10.3)
CO2: 26 mmol/L (ref 22–32)
Chloride: 104 mmol/L (ref 101–111)
Creatinine, Ser: 0.65 mg/dL (ref 0.44–1.00)
GFR calc Af Amer: >60 mL/min (ref >60)
GFR calc non Af Amer: >60 mL/min (ref >60)
GLUCOSE: 88 mg/dL (ref 65–99)
Potassium: 3.6 mmol/L (ref 3.5–5.1)
SODIUM: 140 mmol/L (ref 135–145)
TOTAL PROTEIN: 7.7 g/dL (ref 6.5–8.1)

## 2015-12-20 LAB — I-STAT BETA HCG BLOOD, ED (MC, WL, AP ONLY)

## 2015-12-20 LAB — LIPASE, BLOOD: Lipase: 29 U/L (ref 11–51)

## 2015-12-20 MED ORDER — PANTOPRAZOLE SODIUM 20 MG PO TBEC: 20.0000 mg | DELAYED_RELEASE_TABLET | Freq: Every day | ORAL | Status: DC

## 2015-12-20 MED ORDER — PANTOPRAZOLE SODIUM 40 MG IV SOLR
40.0000 mg | Freq: Once | INTRAVENOUS | Status: AC
  Administered 2015-12-20: 40 mg via INTRAVENOUS
  Filled 2015-12-20: qty 40

## 2015-12-20 MED ORDER — KETOROLAC TROMETHAMINE 30 MG/ML IJ SOLN
30.0000 mg | Freq: Once | INTRAMUSCULAR | Status: AC
  Administered 2015-12-20: 30 mg via INTRAVENOUS

## 2015-12-20 MED ORDER — KETOROLAC TROMETHAMINE 30 MG/ML IJ SOLN
30.0000 mg | Freq: Once | INTRAMUSCULAR | Status: DC
  Filled 2015-12-20: qty 1

## 2015-12-20 NOTE — Discharge Instructions (Signed)
Your abdominal pain is likely from gastritis or an ulcer. You will need to take protonix as directed, and avoid spicy/fatty/acidic foods. Avoid laying down flat within 30 minutes of eating. Avoid NSAIDs like ibuprofen on an empty stomach. Follow up with your primary physician at your scheduled appointment Thursday or the gastroenterologist (GI doctor) in one week for ongoing evaluation of your abdominal pain. Return to the ER for changes or worsening symptoms.  Abdominal (belly) pain can be caused by many things. Your caregiver performed an examination and possibly ordered blood/urine tests and imaging (CT scan, x-rays, ultrasound). Many cases can be observed and treated at home after initial evaluation in the emergency department. Even though you are being discharged home, abdominal pain can be unpredictable. Therefore, you need a repeated exam if your pain does not resolve, returns, or worsens. Most patients with abdominal pain don't have to be admitted to the hospital or have surgery, but serious problems like appendicitis and gallbladder attacks can start out as nonspecific pain. Many abdominal conditions cannot be diagnosed in one visit, so follow-up evaluations are very important. SEEK IMMEDIATE MEDICAL ATTENTION IF YOU DEVELOP ANY OF THE FOLLOWING SYMPTOMS:  The pain does not go away or becomes severe.   A temperature above 101 develops.   Repeated vomiting occurs (multiple episodes).   The pain becomes localized to portions of the abdomen. The right side could possibly be appendicitis. In an adult, the left lower portion of the abdomen could be colitis or diverticulitis.   Blood is being passed in stools or vomit (bright red or black tarry stools).   Return also if you develop chest pain, difficulty breathing, dizziness or fainting, or become confused, poorly responsive, or inconsolable (young children).  The constipation stays for more than 4 days.   There is belly (abdominal) or rectal  pain.   You do not seem to be getting better.

## 2015-12-20 NOTE — ED Notes (Signed)
Pt complaint of right abdominal pain onset Friday; denies n/v/d. States PCP request further evaluation if pain continued to r/o gallstones.

## 2015-12-20 NOTE — ED Notes (Signed)
PA at bedside for pt f/u

## 2015-12-20 NOTE — ED Provider Notes (Signed)
CSN: [id number]     Arrival date & time 12/20/15  1607 History   First MD Initiated Contact with Patient 12/20/15 2046     Chief Complaint  Patient presents with  . Abdominal Pain     (Consider location/radiation/quality/duration/timing/severity/associated sxs/prior Treatment) Patient is a 50 y.o. female presenting with abdominal pain. The history is provided by the patient and medical records. No language interpreter was used.  Abdominal Pain Associated symptoms: no chills, no cough, no diarrhea, no dysuria, no fever, no nausea, no shortness of breath and no vomiting    Mary Li is a 50 y.o. female  with a PMH of HLD who presents to the Emergency Department complaining of waxing-waning, sharp RUQ pain x 3-4 days. Patient was seen at an emergent care where she was given tramadol and told to go to ER if symptoms worsen or she becomes febrile. Patient states pain has worsened, but tramadol does still improve pain. No other medications taken for symptoms PTA. Patient states pain is worse after eating. Denies radiation of pain. Denies associated nausea, vomiting, chest pain, sob.   Past Medical History  Diagnosis Date  . Hypercholesterolemia 06/2012    recommended follow up with primary physician   History reviewed. No pertinent past surgical history. Family History  Problem Relation Age of Onset  . Diabetes Maternal Grandmother   . Ovarian cancer Maternal Grandmother   . Hypertension Paternal Grandmother   . Stroke Paternal Grandmother   . Stroke Mother   . Heart attack Father    Social History  Substance Use Topics  . Smoking status: Never Smoker   . Smokeless tobacco: Never Used  . Alcohol Use: 0.0 oz/week    0 Standard drinks or equivalent per week     Comment: once a week   OB History    Gravida Para Term Preterm AB TAB SAB Ectopic Multiple Living   0              Review of Systems  Constitutional: Negative for fever and chills.  HENT: Negative for congestion.    Eyes: Negative for visual disturbance.  Respiratory: Negative for cough and shortness of breath.   Cardiovascular: Negative.   Gastrointestinal: Positive for abdominal pain. Negative for nausea, vomiting and diarrhea.  Genitourinary: Negative for dysuria.  Musculoskeletal: Negative for myalgias and back pain.  Skin: Negative for rash.  Neurological: Negative for dizziness, weakness and headaches.      Allergies  Review of patient's allergies indicates no known allergies.  Home Medications   Prior to Admission medications   Medication Sig Start Date End Date Taking? Authorizing Provider  aspirin 81 MG tablet Take 81 mg by mouth daily.   Yes Historical Provider, MD  atorvastatin (LIPITOR) 20 MG tablet Take 20 mg by mouth daily.   Yes Historical Provider, MD  CALCIUM PO Take 1 tablet by mouth daily.    Yes Historical Provider, MD  ENPRESSE-28 tablet TAKE 1 TABLET BY MOUTH EVERY DAY 07/20/15  Yes Dara Lordsimothy P Fontaine, MD  Omega-3 Fatty Acids (FISH OIL) 1000 MG CAPS Take 1,000 mg by mouth daily.   Yes Historical Provider, MD  Probiotic Product (PROBIOTIC DAILY PO) Take 1 tablet by mouth daily.    Yes Historical Provider, MD  traMADol (ULTRAM) 50 MG tablet Take 50 mg by mouth daily as needed for moderate pain or severe pain.  12/18/15  Yes Historical Provider, MD  levonorgestrel-ethinyl estradiol (ENPRESSE,TRIVORA) tablet TAKE 1 TABLET EVERY DAY Patient not taking: Reported on 12/20/2015  07/16/15   Dara Lords, MD  pantoprazole (PROTONIX) 20 MG tablet Take 1 tablet (20 mg total) by mouth daily. 12/20/15   Macio Kissoon Pilcher Laporsche Hoeger, PA-C   BP 162/85 mmHg  Pulse 66  Temp(Src) 97.9 F (36.6 C) (Oral)  Resp 18  SpO2 99%  LMP 11/29/2015 Physical Exam  Constitutional: She is oriented to person, place, and time. She appears well-developed and well-nourished.  Alert and in no acute distress  HENT:  Head: Normocephalic and atraumatic.  Cardiovascular: Normal rate, regular rhythm and normal heart  sounds.  Exam reveals no gallop and no friction rub.   No murmur heard. Pulmonary/Chest: Effort normal and breath sounds normal. No respiratory distress. She has no wheezes. She has no rales. She exhibits no tenderness.  Abdominal: Soft. Bowel sounds are normal. She exhibits no distension and no mass. There is no rebound and no guarding.  Right upper quadrant and epigastric tenderness, negative Murphy's.  Neurological: She is alert and oriented to person, place, and time.  Skin: Skin is warm and dry. No rash noted.  Psychiatric: She has a normal mood and affect. Her behavior is normal. Judgment and thought content normal.  Nursing note and vitals reviewed.   ED Course  Procedures (including critical care time) Labs Review Labs Reviewed  URINALYSIS, ROUTINE W REFLEX MICROSCOPIC (NOT AT [facility]) - Abnormal; Notable for the following:    Hgb urine dipstick SMALL (*)    Leukocytes, UA SMALL (*)    All other components within normal limits  URINE MICROSCOPIC-ADD ON - Abnormal; Notable for the following:    Squamous Epithelial / LPF 0-5 (*)    Bacteria, UA RARE (*)    All other components within normal limits  LIPASE, BLOOD  COMPREHENSIVE METABOLIC PANEL  CBC  I-STAT BETA HCG BLOOD, ED ([facility], [facility], [facility] ONLY)    Imaging Review US Abdomen Limited Ruq  12/20/2015  CLINICAL DATA:  Right upper quadrant pain for 4 days. EXAM: US ABDOMEN LIMITED - RIGHT UPPER QUADRANT COMPARISON:  None. FINDINGS: Gallbladder: No gallstones or wall thickening visualized. No sonographic Murphy sign noted by sonographer. Common bile duct: Diameter: 3 mm, normal Liver: No focal lesion identified. Within normal limits in parenchymal echogenicity. IMPRESSION: No evidence of cholelithiasis or cholecystitis. Electronically Signed   By: Burman Nieves M.D.   On: 12/20/2015 21:36   I have personally reviewed and evaluated these images and lab results as part of my medical decision-making.   EKG Interpretation None      MDM    Final diagnoses:  RUQ pain  Gastritis   Mary Li presents for waxing-waning sharp RUQ pain x 3-4 days which is worse after eating. Gallbladder etiology vs. GERD vs. Gastritis vs. PUD Labs: CBC, CMP, lipase, UA, Upreg - all reviewed and reassuring.  Toradol IM And Protonix given in ED with relief. Ultrasound shows no evidence of cholelithiasis or cholecystitis. Patient is afebrile with normal white count. On exam, right upper quadrant and epigastric tenderness. Nonsurgical abdomen. Patient has primary care appointment on Thursday and was encouraged to follow up about today's visit at that appointment. GI referral was also given. Home care instructions including diet changes given and prescription for Protonix. All questions answered.  [facility] Tynetta Bachmann, PA-C 12/20/15 2240  Lorre Nick, MD 12/20/15 [phone]

## 2016-01-19 ENCOUNTER — Other Ambulatory Visit: Payer: Self-pay | Admitting: Family Medicine

## 2016-01-19 DIAGNOSIS — N6001 Solitary cyst of right breast: Secondary | ICD-10-CM

## 2016-03-22 ENCOUNTER — Ambulatory Visit
Admission: RE | Admit: 2016-03-22 | Discharge: 2016-03-22 | Disposition: A | Discharge status: Home / Self Care | Payer: Commercial Managed Care - HMO | Source: Ambulatory Visit | Attending: Family Medicine | Admitting: Family Medicine

## 2016-03-22 DIAGNOSIS — N6001 Solitary cyst of right breast: Secondary | ICD-10-CM

## 2016-07-21 ENCOUNTER — Ambulatory Visit (INDEPENDENT_AMBULATORY_CARE_PROVIDER_SITE_OTHER): Payer: Commercial Managed Care - HMO | Admitting: Gynecology

## 2016-07-21 ENCOUNTER — Encounter: Payer: Self-pay | Admitting: Gynecology

## 2016-07-21 VITALS — BP 122/78 | Ht 65.0 in | Wt 180.0 lb

## 2016-07-21 DIAGNOSIS — Z01419 Encounter for gynecological examination (general) (routine) without abnormal findings: Secondary | ICD-10-CM

## 2016-07-21 MED ORDER — LEVONORG-ETH ESTRAD TRIPHASIC PO TABS: 1.0000 | ORAL_TABLET | Freq: Every day | ORAL | 4 refills | Status: DC

## 2016-07-21 NOTE — Progress Notes (Signed)
    Mary Li October 05, 1966 [id number]        50 y.o.  G0P0  for annual exam.  Doing well without complaints  Past medical history,surgical history, problem list, medications, allergies, family history and social history were all reviewed and documented as reviewed in the EPIC chart.  ROS:  Performed with pertinent positives and negatives included in the history, assessment and plan.   Additional significant findings :  None   Exam: Kennon PortelaKim Gardner assistant Vitals:   07/21/16 0838  BP: 122/78  Weight: 180 lb (81.6 kg)  Height: 5\' 5"  (1.651 m)   Body mass index is 29.95 kg/m.  General appearance:  Normal affect, orientation and appearance. Skin: Grossly normal HEENT: Without gross lesions.  No cervical or supraclavicular adenopathy. Thyroid normal.  Lungs:  Clear without wheezing, rales or rhonchi Cardiac: RR, without RMG Abdominal:  Soft, nontender, without masses, guarding, rebound, organomegaly or hernia Breasts:  Examined lying and sitting without masses, retractions, discharge or axillary adenopathy. Pelvic:  Ext, BUS, Vagina normal  Cervix normal  Uterus anteverted, normal size, shape and contour, midline and mobile nontender   Adnexa without masses or tenderness    Anus and perineum normal   Rectovaginal normal sphincter tone without palpated masses or tenderness.    Assessment/Plan:  50 y.o. G0P0 female for annual exam with regular menses, oral contraceptives.   1. Patient continues on [place] BCPs. Doing well with this. Not having also symptoms during her pill free week. Reviewed risks with her to include thrombosis such as stroke heart attack DVT. Never smoked otherwise healthy. [facility] or discontinuing pills for several months to see how she does. At this point patient prefers to continue another year and readdress this next year. Understands and accepts the risks. Refill 1 year provided. 2. Mammography 03/2016.  Continue with annual mammography when due. SBE monthly reviewed. 3. Pap smear/HPV 06/2014 negative. No Pap smear done today. No history of significant abnormal Pap smears. Plan repeat Pap smear at 5 year interval per current screening guidelines. 4. Health maintenance. No routine lab work done as patient reports this done elsewhere. Follow up in one year, sooner as needed.   Dara LordsFONTAINE,Mary Li P MD, 8:58 AM 07/21/2016

## 2016-07-21 NOTE — Patient Instructions (Signed)

## 2016-09-28 ENCOUNTER — Other Ambulatory Visit: Payer: Self-pay | Admitting: Gynecology

## 2017-04-20 ENCOUNTER — Other Ambulatory Visit: Payer: Self-pay | Admitting: Family Medicine

## 2017-04-20 DIAGNOSIS — N6001 Solitary cyst of right breast: Secondary | ICD-10-CM

## 2017-04-27 ENCOUNTER — Ambulatory Visit
Admission: RE | Admit: 2017-04-27 | Discharge: 2017-04-27 | Disposition: A | Discharge status: Home / Self Care | Payer: Commercial Managed Care - HMO | Source: Ambulatory Visit | Attending: Family Medicine | Admitting: Family Medicine

## 2017-04-27 DIAGNOSIS — N6001 Solitary cyst of right breast: Secondary | ICD-10-CM

## 2017-07-27 ENCOUNTER — Ambulatory Visit (INDEPENDENT_AMBULATORY_CARE_PROVIDER_SITE_OTHER): Payer: 59 | Admitting: Gynecology

## 2017-07-27 ENCOUNTER — Encounter: Payer: Self-pay | Admitting: Gynecology

## 2017-07-27 VITALS — BP 124/70 | Ht 65.5 in | Wt 179.0 lb

## 2017-07-27 DIAGNOSIS — Z01419 Encounter for gynecological examination (general) (routine) without abnormal findings: Secondary | ICD-10-CM | POA: Diagnosis not present

## 2017-07-27 MED ORDER — LEVONORG-ETH ESTRAD TRIPHASIC PO TABS: 1.0000 | ORAL_TABLET | Freq: Every day | ORAL | 4 refills | Status: DC

## 2017-07-27 NOTE — Progress Notes (Signed)
    Mary Li [facility] [birth year] [id number]        51 y.o.  G0P0 for annual gynecologic exam.   Past medical history,surgical history, problem list, medications, allergies, family history and social history were all reviewed and documented as reviewed in the EPIC chart.  ROS:  Performed with pertinent positives and negatives included in the history, assessment and plan.   Additional significant findings :  none   Exam: Kennon Portela assistant Vitals:   07/27/17 0840  BP: 124/70  Weight: 179 lb (81.2 kg)  Height: 5' 5.5" (1.664 m)   Body mass index is 29.33 kg/m.  General appearance:  Normal affect, orientation and appearance. Skin: Grossly normal HEENT: Without gross lesions.  No cervical or supraclavicular adenopathy. Thyroid normal.  Lungs:  Clear without wheezing, rales or rhonchi Cardiac: RR, without RMG Abdominal:  Soft, nontender, without masses, guarding, rebound, organomegaly or hernia Breasts:  Examined lying and sitting without masses, retractions, discharge or axillary adenopathy. Pelvic:  Ext, BUS, Vagina: normal  Cervix: normal  Uterus: anteverted, normal size, shape and contour, midline and mobile nontender   Adnexa: Without masses or tenderness    Anus and perineum: Normal   Rectovaginal: Normal sphincter tone without palpated masses or tenderness.    Assessment/Plan:  51 y.o. G0P0 female for annual gynecologic exam with regular menses, oral contraceptives.   1. Oral contraceptives. Doing well with regular menses.  Reviewed risks versus benefits. Risks to include thrombosis such as stroke heart attack DVT discussed. Options to include continuing for another year and then stopping when she should be into the menopause versus checking a end of pill free week FSH and stopping if elevated, continuing if normal or stopping now, using backup contraception and following menstrual pattern in symptoms. At this point the patient prefers to continue for another year and then  we will plan on stopping the pills next year at age 14. Refill 1 year provided. 2. Mammography 04/2017. Continue with annual mammography when due. Breast exam normal today. 3. Pap smear/HPV 2015. No Pap smear done today. No history of abnormal Pap smears previously. Plan repeat Pap smear at 5 year interval per current screening guidelines. 4. Health maintenance. No routine lab work done as patient does this elsewhere. Follow up 1 year, sooner as needed.   Dara Lords MD, 9:24 AM 07/27/2017

## 2017-07-27 NOTE — Patient Instructions (Addendum)
Followup in one year for annual exam, sooner if any issues 

## 2017-08-31 ENCOUNTER — Other Ambulatory Visit: Payer: Self-pay | Admitting: Gynecology

## 2017-08-31 MED ORDER — LEVONORG-ETH ESTRAD TRIPHASIC PO TABS: 1.0000 | ORAL_TABLET | Freq: Every day | ORAL | 3 refills | Status: DC

## 2018-05-03 ENCOUNTER — Other Ambulatory Visit: Payer: Self-pay | Admitting: Family Medicine

## 2018-05-03 DIAGNOSIS — Z1231 Encounter for screening mammogram for malignant neoplasm of breast: Secondary | ICD-10-CM

## 2018-05-28 ENCOUNTER — Ambulatory Visit
Admission: RE | Admit: 2018-05-28 | Discharge: 2018-05-28 | Disposition: A | Discharge status: Home / Self Care | Payer: 59 | Source: Ambulatory Visit | Attending: Family Medicine | Admitting: Family Medicine

## 2018-05-28 DIAGNOSIS — Z1231 Encounter for screening mammogram for malignant neoplasm of breast: Secondary | ICD-10-CM

## 2018-08-09 ENCOUNTER — Ambulatory Visit: Payer: 59 | Admitting: Gynecology

## 2018-08-09 ENCOUNTER — Encounter: Payer: Self-pay | Admitting: Gynecology

## 2018-08-09 VITALS — BP 120/74 | Ht 65.0 in | Wt 183.0 lb

## 2018-08-09 DIAGNOSIS — Z308 Encounter for other contraceptive management: Secondary | ICD-10-CM

## 2018-08-09 DIAGNOSIS — Z01419 Encounter for gynecological examination (general) (routine) without abnormal findings: Secondary | ICD-10-CM | POA: Diagnosis not present

## 2018-08-09 DIAGNOSIS — Z1151 Encounter for screening for human papillomavirus (HPV): Secondary | ICD-10-CM | POA: Diagnosis not present

## 2018-08-09 NOTE — Addendum Note (Signed)
Addended by: Dayna Barker on: 08/09/2018 09:50 AM   Modules accepted: Orders

## 2018-08-09 NOTE — Progress Notes (Signed)
    [facility] [birth year] [id number]        52 y.o.  G0P0 for annual gynecologic exam.  Doing well without gynecologic complaints.  Past medical history,surgical history, problem list, medications, allergies, family history and social history were all reviewed and documented as reviewed in the EPIC chart.  ROS:  Performed with pertinent positives and negatives included in the history, assessment and plan.   Additional significant findings : None   Exam: Kennon Portela assistant Vitals:   08/09/18 0914  BP: 120/74  Weight: 183 lb (83 kg)  Height: 5\' 5"  (1.651 m)   Body mass index is 30.45 kg/m.  General appearance:  Normal affect, orientation and appearance. Skin: Grossly normal HEENT: Without gross lesions.  No cervical or supraclavicular adenopathy. Thyroid normal.  Lungs:  Clear without wheezing, rales or rhonchi Cardiac: RR, without RMG Abdominal:  Soft, nontender, without masses, guarding, rebound, organomegaly or hernia Breasts:  Examined lying and sitting without masses, retractions, discharge or axillary adenopathy. Pelvic:  Ext, BUS, Vagina: Normal  Cervix: Normal.  Pap smear/HPV  Uterus: Anteverted, normal size, shape and contour, midline and mobile nontender   Adnexa: Without masses or tenderness    Anus and perineum: Normal   Rectovaginal: Normal sphincter tone without palpated masses or tenderness.    Assessment/Plan:  52 y.o. G0P0 female for annual gynecologic exam regular menses, oral contraceptives.   1. Contraceptive management.  We discussed her birth control pills at age 52.  Options for management to include end of pill free week FSH versus stopping the pills waiting a month or 2 and then [facility] as well as monitoring for symptoms using back-up contraception.  Risks of continuing pills reviewed to include thrombosis such as stroke heart attack DVT although risk low in an active otherwise healthy patient.  Patient just received a 38-month supply of her pills.   We both agree for her to continue these through the holidays and then stopping them at the end of the third pack.  Using back-up contraception and monitoring for symptoms.  Check FSH 1 to 2 months after stopping and then go from there.  If significant symptoms then re-present for HRT discussion. 2. Mammography 05/2018.  Continue with annual mammography when due.  Breast exam normal today. 3. Pap smear/HPV 06/2014.  Pap smear/HPV today.  No history of significant abnormal Pap smears previously. 4. Colonoscopy 2016.  Repeat at their recommended interval. 5. Health maintenance.  No routine lab work done as patient does this elsewhere.  Follow-up for [facility] results in several months.  Follow-up sooner if menopausal symptoms after stopping the pills.  Follow-up in 1 year for annual exam.   Dara Lords MD, 9:42 AM 08/09/2018

## 2018-08-09 NOTE — Patient Instructions (Signed)
Stop the birth control pills after the holidays as we discussed.  Follow-up for blood testing 1 to 2 months after that.  Follow-up sooner if you would develop significant menopausal symptoms or other issues after stopping the pills.

## 2018-08-12 LAB — PAP IG AND HPV HIGH-RISK: HPV DNA HIGH RISK: NOT DETECTED

## 2018-10-28 ENCOUNTER — Other Ambulatory Visit: Payer: Self-pay | Admitting: Gynecology

## 2018-11-29 ENCOUNTER — Other Ambulatory Visit: Payer: 59

## 2018-12-27 ENCOUNTER — Encounter (INDEPENDENT_AMBULATORY_CARE_PROVIDER_SITE_OTHER): Payer: Self-pay

## 2018-12-27 ENCOUNTER — Other Ambulatory Visit: Payer: 59

## 2018-12-27 ENCOUNTER — Other Ambulatory Visit: Payer: Self-pay

## 2018-12-27 DIAGNOSIS — Z308 Encounter for other contraceptive management: Secondary | ICD-10-CM

## 2018-12-28 LAB — FOLLICLE STIMULATING HORMONE: FSH: 94.9 m[IU]/mL

## 2019-06-24 ENCOUNTER — Other Ambulatory Visit: Payer: Self-pay | Admitting: Nurse Practitioner

## 2019-06-24 DIAGNOSIS — Z1231 Encounter for screening mammogram for malignant neoplasm of breast: Secondary | ICD-10-CM

## 2019-07-09 ENCOUNTER — Encounter: Payer: Self-pay | Admitting: Gynecology

## 2019-08-08 ENCOUNTER — Other Ambulatory Visit: Payer: Self-pay

## 2019-08-11 ENCOUNTER — Encounter: Payer: Self-pay | Admitting: Gynecology

## 2019-08-11 ENCOUNTER — Other Ambulatory Visit: Payer: Self-pay

## 2019-08-11 ENCOUNTER — Ambulatory Visit
Admission: RE | Admit: 2019-08-11 | Discharge: 2019-08-11 | Disposition: A | Discharge status: Home / Self Care | Payer: 59 | Source: Ambulatory Visit | Attending: Nurse Practitioner | Admitting: Nurse Practitioner

## 2019-08-11 ENCOUNTER — Ambulatory Visit (INDEPENDENT_AMBULATORY_CARE_PROVIDER_SITE_OTHER): Payer: 59 | Admitting: Gynecology

## 2019-08-11 VITALS — BP 120/78 | Ht 65.0 in | Wt 186.0 lb

## 2019-08-11 DIAGNOSIS — Z01419 Encounter for gynecological examination (general) (routine) without abnormal findings: Secondary | ICD-10-CM | POA: Diagnosis not present

## 2019-08-11 DIAGNOSIS — Z23 Encounter for immunization: Secondary | ICD-10-CM

## 2019-08-11 DIAGNOSIS — Z1231 Encounter for screening mammogram for malignant neoplasm of breast: Secondary | ICD-10-CM

## 2019-08-11 NOTE — Progress Notes (Signed)
    Mary Li 04-22-1966 [id number]        53 y.o.  G0P0 for annual gynecologic exam.  Remains without menses after stopping oral contraceptives.  [place] elevated last year.  Doing well without significant menopausal symptoms.  Past medical history,surgical history, problem list, medications, allergies, family history and social history were all reviewed and documented as reviewed in the EPIC chart.  ROS:  Performed with pertinent positives and negatives included in the history, assessment and plan.   Additional significant findings : None   Exam: Caryn Bee assistant Vitals:   08/11/19 1355  BP: 120/78  Weight: 186 lb (84.4 kg)  Height: 5\' 5"  (1.651 m)   Body mass index is 30.95 kg/m.  General appearance:  Normal affect, orientation and appearance. Skin: Grossly normal HEENT: Without gross lesions.  No cervical or supraclavicular adenopathy. Thyroid normal.  Lungs:  Clear without wheezing, rales or rhonchi Cardiac: RR, without RMG Abdominal:  Soft, nontender, without masses, guarding, rebound, organomegaly or hernia Breasts:  Examined lying and sitting without masses, retractions, discharge or axillary adenopathy. Pelvic:  Ext, BUS, Vagina: Normal  Cervix: Normal  Uterus: Anteverted, normal size, shape and contour, midline and mobile nontender   Adnexa: Without masses or tenderness    Anus and perineum: Normal   Rectovaginal: Normal sphincter tone without palpated masses or tenderness.    Assessment/Plan:  53 y.o. G0P0 female for annual gynecologic exam.   1. Postmenopausal.  No significant menopausal symptoms or any vaginal bleeding. 2. Mammography today.  Breast exam normal today. 3. Pap smear/HPV 2019.  No Pap smear done today.  No history of abnormal Pap smears previously.  Plan repeat Pap smear/HPV at 5-year interval per current screening guidelines. 4. Colonoscopy 2016.  Repeat at their recommended interval. 5. DEXA never.  Will plan further into the menopause.  6. Health maintenance.  No routine lab work done as patient does this elsewhere.  Follow-up 1 year, sooner as needed.   Anastasio Auerbach MD, 2:46 PM 08/11/2019

## 2019-08-11 NOTE — Patient Instructions (Signed)
Follow-up in 1 year for annual exam, sooner as needed. 

## 2019-08-12 ENCOUNTER — Encounter: Payer: 59 | Admitting: Gynecology

## 2020-08-11 ENCOUNTER — Ambulatory Visit (INDEPENDENT_AMBULATORY_CARE_PROVIDER_SITE_OTHER): Payer: 59 | Admitting: Obstetrics and Gynecology

## 2020-08-11 ENCOUNTER — Encounter: Payer: 59 | Admitting: Gynecology

## 2020-08-11 ENCOUNTER — Encounter: Payer: Self-pay | Admitting: Obstetrics and Gynecology

## 2020-08-11 ENCOUNTER — Other Ambulatory Visit: Payer: Self-pay

## 2020-08-11 VITALS — BP 120/74 | Ht 66.0 in | Wt 187.0 lb

## 2020-08-11 DIAGNOSIS — Z01419 Encounter for gynecological examination (general) (routine) without abnormal findings: Secondary | ICD-10-CM | POA: Diagnosis not present

## 2020-08-11 DIAGNOSIS — Z23 Encounter for immunization: Secondary | ICD-10-CM | POA: Diagnosis not present

## 2020-08-11 NOTE — Progress Notes (Signed)
   Mary Li October 17, 1965 [id number]  SUBJECTIVE:  54 y.o. G0P0 female here for a breast and pelvic exam. She has no gynecologic concerns.  Current Outpatient Medications  Medication Sig Dispense Refill  . amLODipine (NORVASC) 2.5 MG tablet Take 2.5 mg by mouth daily.    Marland Kitchen atorvastatin (LIPITOR) 20 MG tablet Take 20 mg by mouth daily.    Marland Kitchen CALCIUM PO Take 1 tablet by mouth daily.     . Omega-3 Fatty Acids (FISH OIL) 1000 MG CAPS Take 1,000 mg by mouth daily.    . pantoprazole (PROTONIX) 20 MG tablet Take 1 tablet (20 mg total) by mouth daily. 30 tablet 0   No current facility-administered medications for this visit.   Allergies: Patient has no known allergies.  Patient's last menstrual period was 08/06/2018.  Past medical history,surgical history, problem list, medications, allergies, family history and social history were all reviewed and documented as reviewed in the EPIC chart.  GYN ROS: no abnormal bleeding, pelvic pain or discharge, no breast pain or new or enlarging lumps on self exam.  No dysuria, urinary frequency, pain with urination, cloudy/malodorous urine.   OBJECTIVE:  BP 120/74   Ht 5\' 6"  (1.676 m)   Wt 187 lb (84.8 kg)   LMP 08/06/2018   BMI 30.18 kg/m  The patient appears well, alert, oriented, in no distress.  BREAST EXAM: breasts appear normal, no suspicious masses, no skin or nipple changes or axillary nodes  PELVIC EXAM: VULVA: normal appearing vulva with atrophic change, no masses, tenderness or lesions, VAGINA: normal appearing vagina with atrophic change, normal color and discharge, no lesions, CERVIX: normal appearing atrophic cervix without discharge or lesions, UTERUS: uterus is normal size, shape, consistency and nontender, ADNEXA: normal adnexa in size, nontender and no masses  Chaperone: 08/08/2018 present during the examination  ASSESSMENT:  54 y.o. G0P0 here for a breast and pelvic exam  PLAN:   1. Postmenopausal.  No significant hot  flashes or night sweats.  No vaginal bleeding since discontinuing the OCP last year. 2. Pap smear/HPV 2019.  No significant history of abnormal Pap smears.  Next Pap smear due 2024 following the current guidelines recommending the 5 year interval. 3. Mammogram 07/2019.  Normal breast exam today.  She is reminded to schedule an annual mammogram this year. 4. Colonoscopy 2016.  She will follow up at the interval recommended by her GI specialist.   5. DEXA never.  Plan further into menopause. 6. Health maintenance.  No labs today as she normally has these completed swear.  Return annually or sooner, prn.  2017 MD 08/11/20

## 2021-06-03 IMAGING — MG DIGITAL SCREENING BILAT W/ TOMO W/ CAD
5 series · 6 of 13 positions shown · non-contrast
Comparison: Previous exam(s).

CLINICAL DATA: Screening.

EXAM:
DIGITAL SCREENING BILATERAL MAMMOGRAM WITH TOMO AND CAD

[R CC synth-2D]
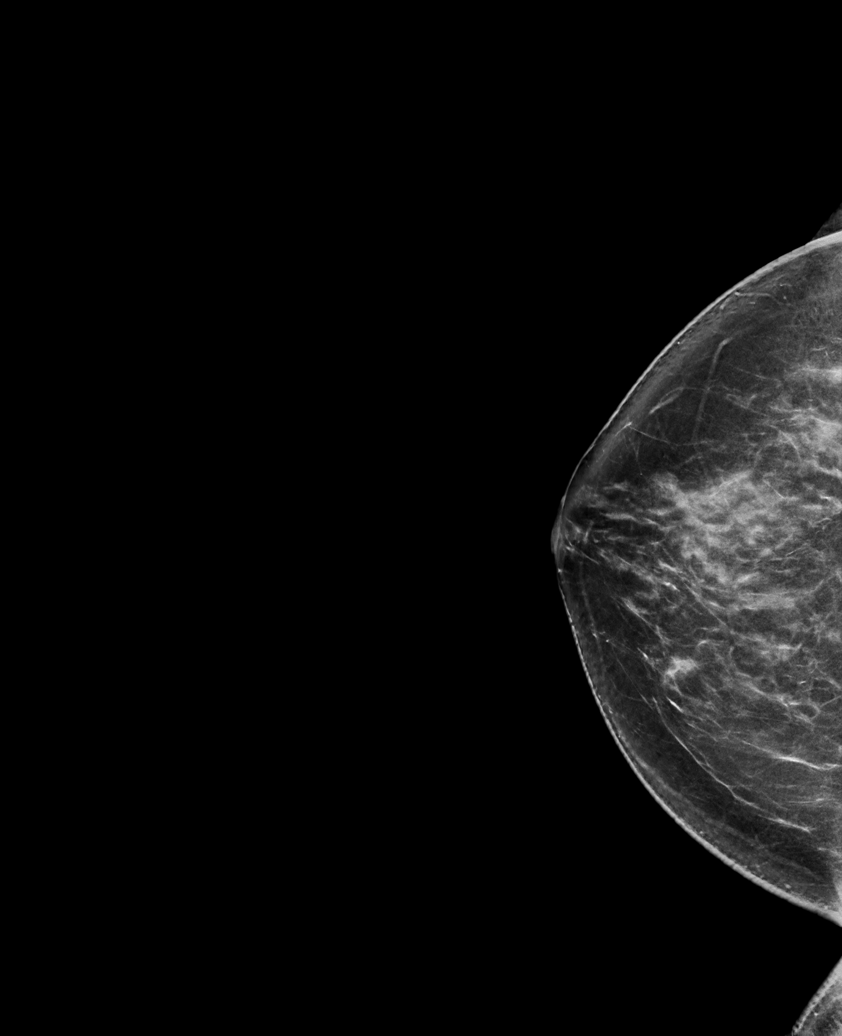

[L CC synth-2D]
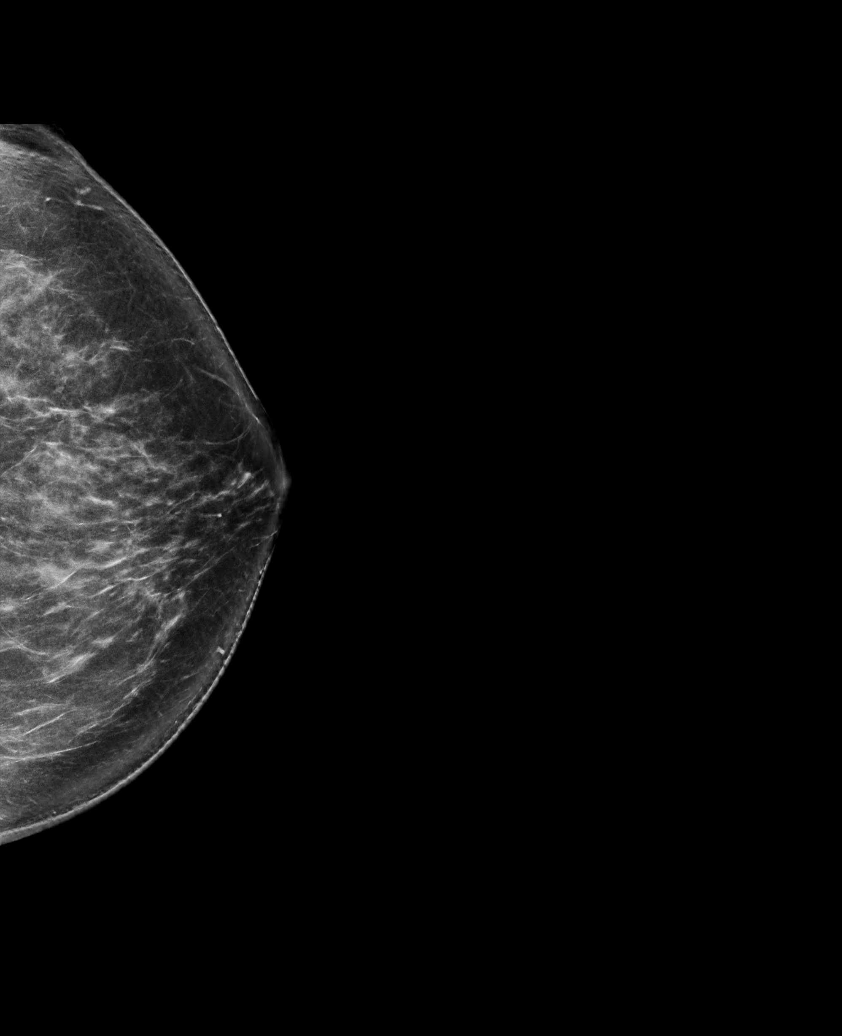

[L MLO synth-2D]
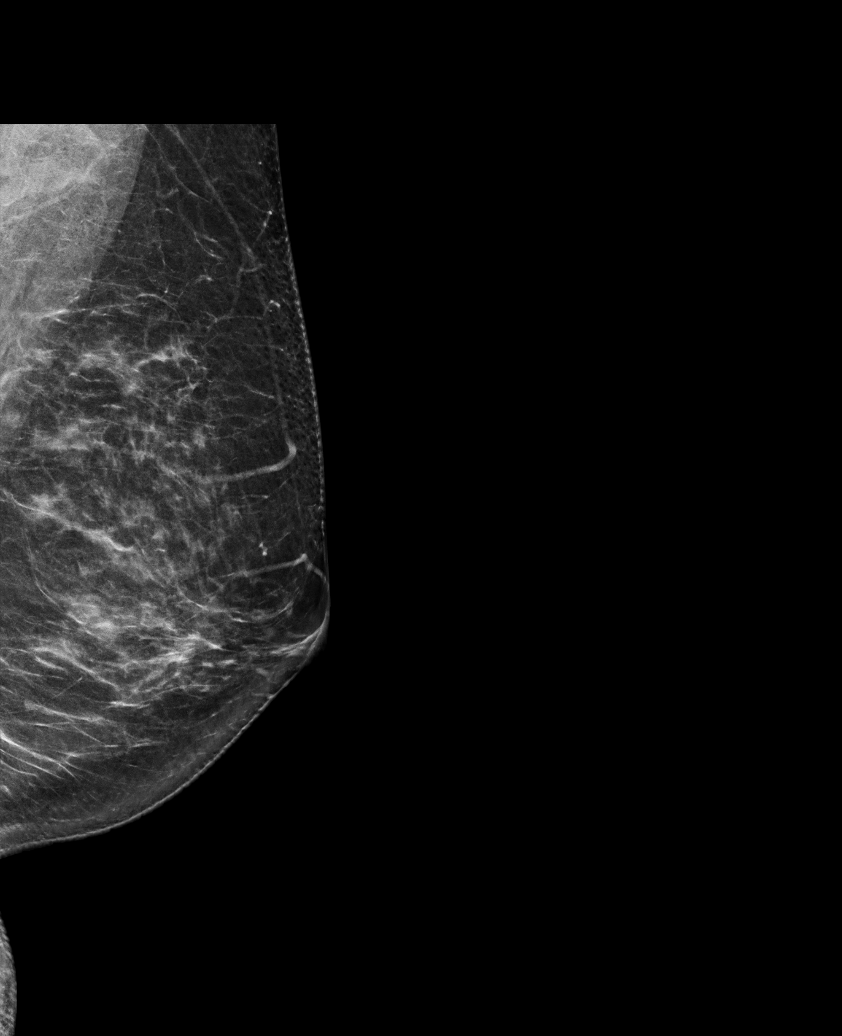

[R CC tomo · 2 of 76 frames shown]
[frame 25/76]
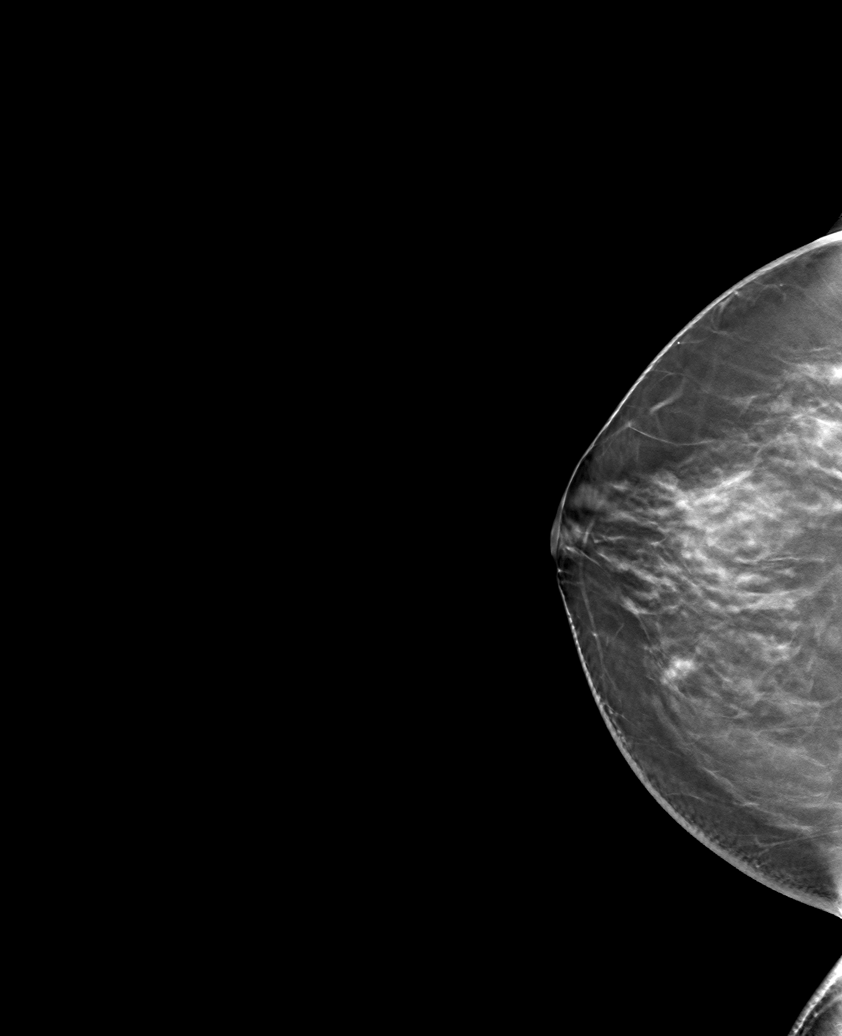
[frame 39/76]
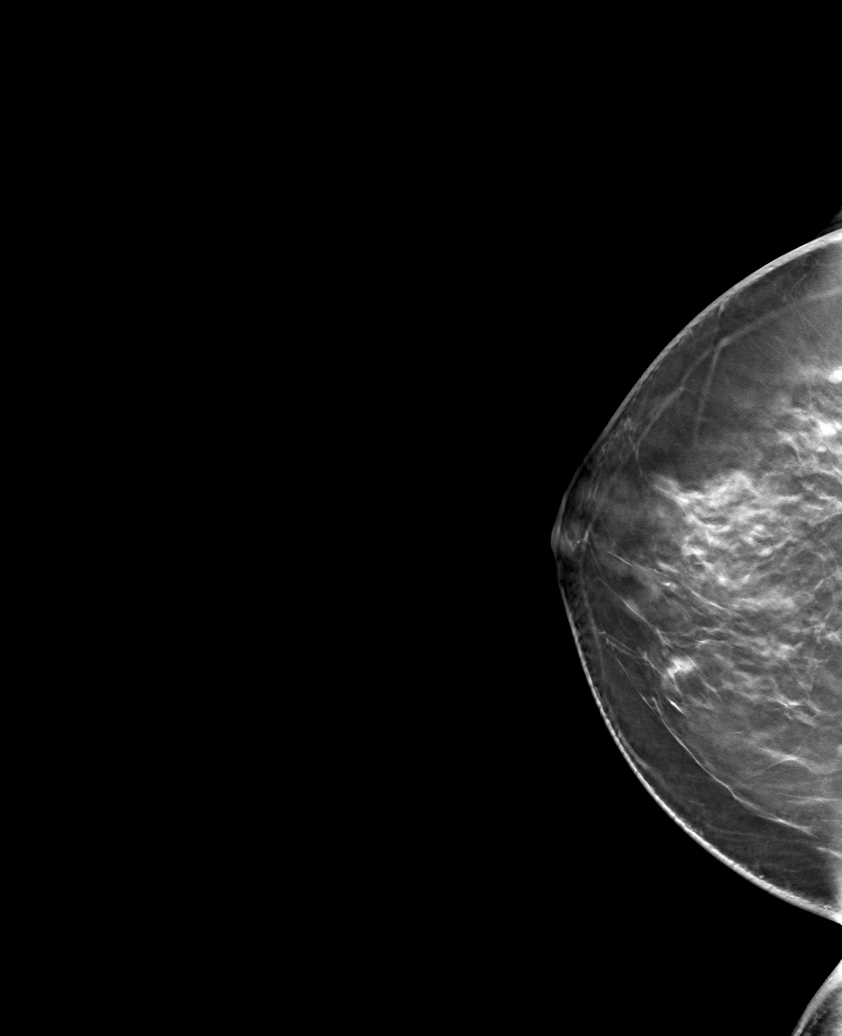

[L CC tomo · tomo slice 41/80.0]
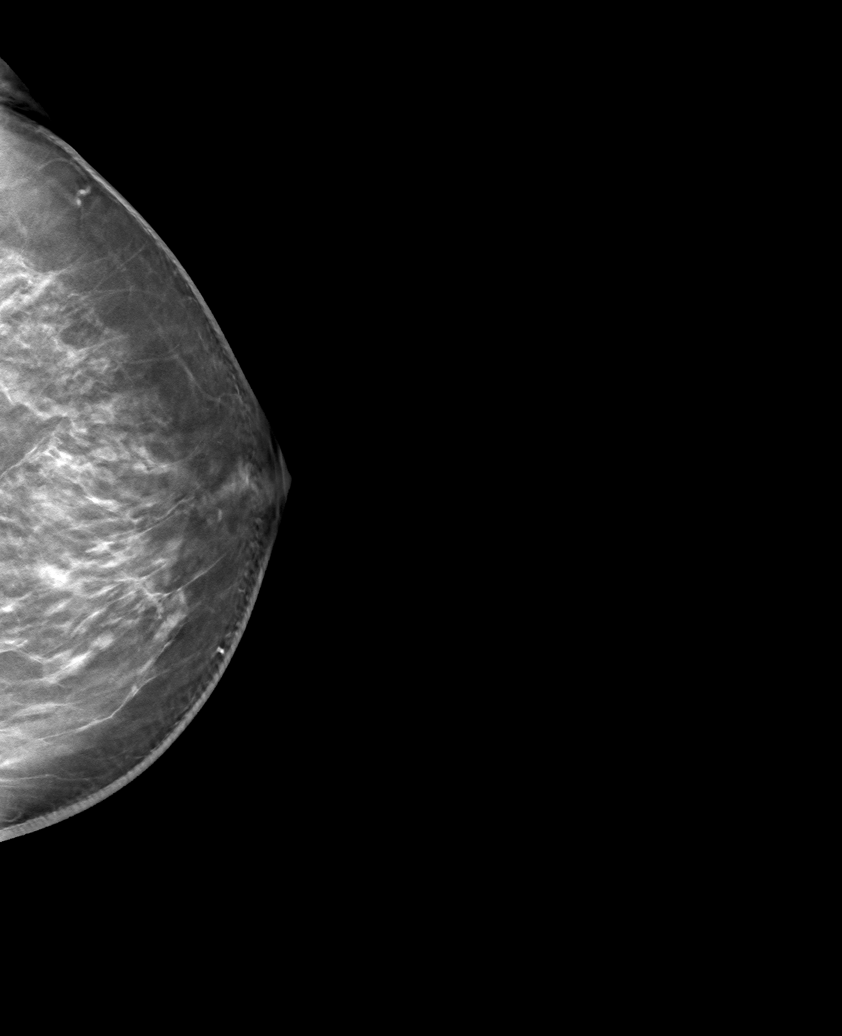

[6 of 13 positions shown; findings below may reference images not displayed]

ACR Breast Density Category c: The breast tissue is heterogeneously
dense, which may obscure small masses.
FINDINGS: There are no findings suspicious for malignancy. Images were
processed with CAD.
IMPRESSION: No mammographic evidence of malignancy. A result letter of this
screening mammogram will be mailed directly to the patient.

RECOMMENDATION:
Screening mammogram in one year. (Code:FT-U-LHB)

BI-RADS CATEGORY  1: Negative.

## 2021-08-11 ENCOUNTER — Ambulatory Visit (INDEPENDENT_AMBULATORY_CARE_PROVIDER_SITE_OTHER): Payer: 59 | Admitting: Nurse Practitioner

## 2021-08-11 ENCOUNTER — Other Ambulatory Visit: Payer: Self-pay

## 2021-08-11 ENCOUNTER — Encounter: Payer: Self-pay | Admitting: Nurse Practitioner

## 2021-08-11 ENCOUNTER — Ambulatory Visit: Payer: 59 | Admitting: Obstetrics and Gynecology

## 2021-08-11 VITALS — BP 118/76 | Ht 64.0 in | Wt 185.0 lb

## 2021-08-11 DIAGNOSIS — Z23 Encounter for immunization: Secondary | ICD-10-CM

## 2021-08-11 DIAGNOSIS — Z01419 Encounter for gynecological examination (general) (routine) without abnormal findings: Secondary | ICD-10-CM

## 2021-08-11 DIAGNOSIS — Z780 Asymptomatic menopausal state: Secondary | ICD-10-CM | POA: Diagnosis not present

## 2021-08-11 NOTE — Progress Notes (Signed)
   [facility] Apr 12, 1966 [id number]   History:  55 y.o. G0 presents for annual exam. Postmenopausal - no HRT, no bleeding. Normal pap history.   Gynecologic History Patient's last menstrual period was 08/06/2018.   Contraception/Family planning: post menopausal status Sexually active: Yes  Health Maintenance Last Pap: 08/09/2018. Results were: Normal, 5-year repeat Last mammogram: 08/11/2019. Results were: Normal Last colonoscopy: 2016. Results were: Normal, 10-year recall Last Dexa: N/A  Past medical history, past surgical history, family history and social history were all reviewed and documented in the EPIC chart. Married. Social research officer, government for [employer].   ROS:  A ROS was performed and pertinent positives and negatives are included.  Exam:  Vitals:   08/11/21 0844  BP: 118/76  Weight: 185 lb (83.9 kg)  Height: 5\' 4"  (1.626 m)   Body mass index is 31.76 kg/m.  General appearance:  Normal Thyroid:  Symmetrical, normal in size, without palpable masses or nodularity. Respiratory  Auscultation:  Clear without wheezing or rhonchi Cardiovascular  Auscultation:  Regular rate, without rubs, murmurs or gallops  Edema/varicosities:  Not grossly evident Abdominal  Soft,nontender, without masses, guarding or rebound.  Liver/spleen:  No organomegaly noted  Hernia:  None appreciated  Skin  Inspection:  Grossly normal Breasts: Examined lying and sitting.   Right: Without masses, retractions, nipple discharge or axillary adenopathy.   Left: Without masses, retractions, nipple discharge or axillary adenopathy. Genitourinary   Inguinal/mons:  Normal without inguinal adenopathy  External genitalia:  Normal appearing vulva with no masses, tenderness, or lesions  BUS/Urethra/Skene's glands:  Normal  Vagina:  Normal appearing with normal color and discharge, no lesions  Cervix:  Normal appearing without discharge or lesions  Uterus:  Normal in size, shape and contour.  Midline and  mobile, nontender  Adnexa/parametria:     Rt: Normal in size, without masses or tenderness.   Lt: Normal in size, without masses or tenderness.  Anus and perineum: Normal  Digital rectal exam: Normal sphincter tone without palpated masses or tenderness  Patient informed chaperone available to be present for breast and pelvic exam. Patient has requested no chaperone to be present. Patient has been advised what will be completed during breast and pelvic exam.   Assessment/Plan:  55 y.o. G0 for annual exam.   Well female exam with routine gynecological exam - Education provided on SBEs, importance of preventative screenings, current guidelines, high calcium diet, regular exercise, and multivitamin daily.  Labs with PCP.   Postmenopausal - no HRT, no bleeding.   Need for immunization against influenza - Plan: Flu Vaccine QUAD 41mo+IM (Fluarix, Fluzone & Alfiuria Quad PF)  Screening for cervical cancer - Normal Pap history.  Will repeat at 5-year interval per guidelines.  Screening for breast cancer - Normal mammogram history.  Overdue and encouraged to schedule this soon. Normal breast exam today.  Screening for colon cancer - 2016 colonoscopy. Will repeat at GI's recommended interval.   Return in 1 year for annual.   2017 DNP, 9:24 AM 08/11/2021

## 2022-08-14 NOTE — Progress Notes (Unsigned)
   [facility] May 29, 1966 [id number]   History:  56 y.o. G0 presents for annual exam. Postmenopausal - no HRT, no bleeding. Normal pap history. HLD, HTN managed by PCP. Having foot surgery in January.   Gynecologic History Patient's last menstrual period was 08/06/2018.   Contraception/Family planning: post menopausal status Sexually active: Yes  Health Maintenance Last Pap: 08/09/2018. Results were: Normal neg HPV Last mammogram: 08/11/2019. Results were: Normal Last colonoscopy: 2016. Results were: Normal, 10-year recall Last Dexa: Not indicated  Past medical history, past surgical history, family history and social history were all reviewed and documented in the EPIC chart. 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   ROS:  A ROS was performed and pertinent positives and negatives are included.  Exam:  Vitals:   08/15/22 0833  BP: 108/72  Pulse: 85  SpO2: 95%  Weight: 193 lb (87.5 kg)  Height: 5' 4.75" (1.645 m)    Body mass index is 32.37 kg/m.  General appearance:  Normal Thyroid:  Symmetrical, normal in size, without palpable masses or nodularity. Respiratory  Auscultation:  Clear without wheezing or rhonchi Cardiovascular  Auscultation:  Regular rate, without rubs, murmurs or gallops  Edema/varicosities:  Not grossly evident Abdominal  Soft,nontender, without masses, guarding or rebound.  Liver/spleen:  No organomegaly noted  Hernia:  None appreciated  Skin  Inspection:  Grossly normal Breasts: Examined lying and sitting.   Right: Without masses, retractions, nipple discharge or axillary adenopathy.   Left: Without masses, retractions, nipple discharge or axillary adenopathy. Genitourinary   Inguinal/mons:  Normal without inguinal adenopathy  External genitalia:  Normal appearing vulva with no masses, tenderness, or lesions  BUS/Urethra/Skene's glands:  Normal  Vagina:  Normal appearing with normal color and discharge, no lesions  Cervix:  Normal appearing  without discharge or lesions  Uterus:  Normal in size, shape and contour.  Midline and mobile, nontender  Adnexa/parametria:     Rt: Normal in size, without masses or tenderness.   Lt: Normal in size, without masses or tenderness.  Anus and perineum: Normal  Digital rectal exam: Normal sphincter tone without palpated masses or tenderness  Patient informed chaperone available to be present for breast and pelvic exam. Patient has requested no chaperone to be present. Patient has been advised what will be completed during breast and pelvic exam.   Assessment/Plan:  56 y.o. G0 for annual exam.   Well female exam with routine gynecological exam - Education provided on SBEs, importance of preventative screenings, current guidelines, high calcium diet, regular exercise, and multivitamin daily.  Labs with PCP.   Postmenopausal - Plan: DG Bone Density. No HRT, no bleeding  Family history of osteoporosis in mother - Plan: DG Bone Density  Screening for cervical cancer - Normal Pap history.  Will repeat at 5-year interval per guidelines.  Screening for breast cancer - Normal mammogram history.  Overdue and encouraged to schedule this soon. Normal breast exam today.  Screening for colon cancer - 2016 colonoscopy. Will repeat at GI's recommended interval.   Return in 1 year for annual.     Tamela Gammon DNP, 8:56 AM 08/15/2022

## 2022-08-15 ENCOUNTER — Encounter: Payer: Self-pay | Admitting: Nurse Practitioner

## 2022-08-15 ENCOUNTER — Ambulatory Visit (INDEPENDENT_AMBULATORY_CARE_PROVIDER_SITE_OTHER): Payer: 59 | Admitting: Nurse Practitioner

## 2022-08-15 VITALS — BP 108/72 | HR 85 | Ht 64.75 in | Wt 193.0 lb

## 2022-08-15 DIAGNOSIS — Z8262 Family history of osteoporosis: Secondary | ICD-10-CM

## 2022-08-15 DIAGNOSIS — Z780 Asymptomatic menopausal state: Secondary | ICD-10-CM

## 2022-08-15 DIAGNOSIS — Z01419 Encounter for gynecological examination (general) (routine) without abnormal findings: Secondary | ICD-10-CM | POA: Diagnosis not present

## 2022-08-17 ENCOUNTER — Other Ambulatory Visit: Payer: Self-pay | Admitting: Family Medicine

## 2022-08-17 DIAGNOSIS — Z1231 Encounter for screening mammogram for malignant neoplasm of breast: Secondary | ICD-10-CM

## 2022-08-23 ENCOUNTER — Ambulatory Visit (INDEPENDENT_AMBULATORY_CARE_PROVIDER_SITE_OTHER): Payer: 59

## 2022-08-23 ENCOUNTER — Other Ambulatory Visit: Payer: Self-pay | Admitting: Nurse Practitioner

## 2022-08-23 DIAGNOSIS — Z780 Asymptomatic menopausal state: Secondary | ICD-10-CM | POA: Diagnosis not present

## 2022-08-23 DIAGNOSIS — Z13820 Encounter for screening for osteoporosis: Secondary | ICD-10-CM

## 2022-08-23 DIAGNOSIS — Z01419 Encounter for gynecological examination (general) (routine) without abnormal findings: Secondary | ICD-10-CM

## 2022-08-23 DIAGNOSIS — Z8262 Family history of osteoporosis: Secondary | ICD-10-CM

## 2022-12-22 ENCOUNTER — Ambulatory Visit
Admission: RE | Admit: 2022-12-22 | Discharge: 2022-12-22 | Disposition: A | Discharge status: Home / Self Care | Payer: 59 | Source: Ambulatory Visit | Attending: Family Medicine | Admitting: Family Medicine

## 2022-12-22 DIAGNOSIS — Z1231 Encounter for screening mammogram for malignant neoplasm of breast: Secondary | ICD-10-CM

## 2023-08-21 ENCOUNTER — Other Ambulatory Visit (HOSPITAL_COMMUNITY)
Admission: RE | Admit: 2023-08-21 | Discharge: 2023-08-21 | Disposition: A | Discharge status: Home / Self Care | Payer: 59 | Source: Ambulatory Visit | Attending: Nurse Practitioner | Admitting: Nurse Practitioner

## 2023-08-21 ENCOUNTER — Ambulatory Visit (INDEPENDENT_AMBULATORY_CARE_PROVIDER_SITE_OTHER): Payer: 59 | Admitting: Nurse Practitioner

## 2023-08-21 ENCOUNTER — Encounter: Payer: Self-pay | Admitting: Nurse Practitioner

## 2023-08-21 VITALS — BP 112/64 | HR 78 | Ht 64.0 in | Wt 189.0 lb

## 2023-08-21 DIAGNOSIS — Z124 Encounter for screening for malignant neoplasm of cervix: Secondary | ICD-10-CM

## 2023-08-21 DIAGNOSIS — Z780 Asymptomatic menopausal state: Secondary | ICD-10-CM | POA: Diagnosis not present

## 2023-08-21 DIAGNOSIS — Z01419 Encounter for gynecological examination (general) (routine) without abnormal findings: Secondary | ICD-10-CM

## 2023-08-21 NOTE — Progress Notes (Signed)
   [facility] Jan 25, 1966 [id number]   History:  57 y.o. G0 presents for annual exam. Postmenopausal - no HRT, no bleeding. Normal pap history. HLD, HTN, pre-diabetes managed by PCP.   Gynecologic History Patient's last menstrual period was 08/06/2018.   Contraception/Family planning: post menopausal status Sexually active: Yes  Health Maintenance Last Pap: 08/09/2018. Results were: Normal neg HPV Last mammogram: 12/22/2022. Results were: Normal Last colonoscopy: 03/21/2016. Results were: Normal, 10-year recall Last Dexa: 08/23/2022. Results were: Normal  Past medical history, past surgical history, family history and social history were all reviewed and documented in the EPIC chart. Married. Social research officer, government for [employer].   ROS:  A ROS was performed and pertinent positives and negatives are included.  Exam:  Vitals:   08/21/23 0831  BP: 112/64  Pulse: 78  SpO2: 100%  Weight: 189 lb (85.7 kg)  Height: 5\' 4"  (1.626 m)     Body mass index is 32.44 kg/m.  General appearance:  Normal Thyroid:  Symmetrical, normal in size, without palpable masses or nodularity. Respiratory  Auscultation:  Clear without wheezing or rhonchi Cardiovascular  Auscultation:  Regular rate, without rubs, murmurs or gallops  Edema/varicosities:  Not grossly evident Abdominal  Soft,nontender, without masses, guarding or rebound.  Liver/spleen:  No organomegaly noted  Hernia:  None appreciated  Skin  Inspection:  Grossly normal Breasts: Examined lying and sitting.   Right: Without masses, retractions, nipple discharge or axillary adenopathy.   Left: Without masses, retractions, nipple discharge or axillary adenopathy. Pelvic: External genitalia:  no lesions              Urethra:  normal appearing urethra with no masses, tenderness or lesions              Bartholins and Skenes: normal                 Vagina: normal appearing vagina with normal color and discharge, no lesions              Cervix: no  lesions Bimanual Exam:  Uterus:  no masses or tenderness              Adnexa: no mass, fullness, tenderness              Rectovaginal: Deferred              Anus:  normal, no lesions  Patient informed chaperone available to be present for breast and pelvic exam. Patient has requested no chaperone to be present. Patient has been advised what will be completed during breast and pelvic exam.   Assessment/Plan:  57 y.o. G0 for annual exam.   Well female exam with routine gynecological exam - Education provided on SBEs, importance of preventative screenings, current guidelines, high calcium diet, regular exercise, and multivitamin daily.  Labs with PCP.   Postmenopausal - No HRT, no bleeding  Cervical cancer screening - Plan: Cytology - PAP( [facility]). Normal pap history.   Screening for breast cancer - Normal mammogram history.  Continue annual screenings. Normal breast exam today.  Screening for colon cancer - 2017 colonoscopy. Will repeat at GI's recommended interval.   Screening for osteoporosis - Normal bone density 08/2022.  Return in about 1 year (around 08/20/2024) for Annual.      Olivia Mackie DNP, 8:47 AM 08/21/2023

## 2023-08-24 LAB — CYTOLOGY - PAP
Comment: NEGATIVE
Diagnosis: NEGATIVE
High risk HPV: NEGATIVE

## 2023-12-06 ENCOUNTER — Emergency Department (HOSPITAL_COMMUNITY)
Admission: EM | Admit: 2023-12-06 | Discharge: 2023-12-07 | Disposition: A | Discharge status: Home / Self Care | Payer: 59 | Attending: Student | Admitting: Student

## 2023-12-06 ENCOUNTER — Encounter (HOSPITAL_COMMUNITY): Payer: Self-pay | Admitting: Emergency Medicine

## 2023-12-06 ENCOUNTER — Other Ambulatory Visit: Payer: Self-pay

## 2023-12-06 DIAGNOSIS — N200 Calculus of kidney: Secondary | ICD-10-CM

## 2023-12-06 DIAGNOSIS — Z7984 Long term (current) use of oral hypoglycemic drugs: Secondary | ICD-10-CM | POA: Diagnosis not present

## 2023-12-06 DIAGNOSIS — D72829 Elevated white blood cell count, unspecified: Secondary | ICD-10-CM | POA: Insufficient documentation

## 2023-12-06 DIAGNOSIS — R109 Unspecified abdominal pain: Secondary | ICD-10-CM | POA: Diagnosis present

## 2023-12-06 DIAGNOSIS — I10 Essential (primary) hypertension: Secondary | ICD-10-CM | POA: Diagnosis not present

## 2023-12-06 DIAGNOSIS — Z79899 Other long term (current) drug therapy: Secondary | ICD-10-CM | POA: Diagnosis not present

## 2023-12-06 DIAGNOSIS — Z8616 Personal history of COVID-19: Secondary | ICD-10-CM | POA: Insufficient documentation

## 2023-12-06 DIAGNOSIS — N132 Hydronephrosis with renal and ureteral calculous obstruction: Secondary | ICD-10-CM | POA: Diagnosis not present

## 2023-12-06 LAB — URINALYSIS, ROUTINE W REFLEX MICROSCOPIC
Bilirubin Urine: NEGATIVE
Glucose, UA: NEGATIVE mg/dL
Hgb urine dipstick: NEGATIVE
Ketones, ur: NEGATIVE mg/dL
Leukocytes,Ua: NEGATIVE
Nitrite: NEGATIVE
Protein, ur: NEGATIVE mg/dL
Specific Gravity, Urine: 1.011 (ref 1.005–1.030)
pH: 7 (ref 5.0–8.0)

## 2023-12-06 LAB — COMPREHENSIVE METABOLIC PANEL
ALT: 26 U/L (ref 0–44)
AST: 25 U/L (ref 15–41)
Albumin: 4.6 g/dL (ref 3.5–5.0)
Alkaline Phosphatase: 70 U/L (ref 38–126)
Anion gap: 10 (ref 5–15)
BUN: 17 mg/dL (ref 6–20)
CO2: 25 mmol/L (ref 22–32)
Calcium: 9.9 mg/dL (ref 8.9–10.3)
Chloride: 107 mmol/L (ref 98–111)
Creatinine, Ser: 0.77 mg/dL (ref 0.44–1.00)
GFR, Estimated: >60 mL/min (ref >60)
Glucose, Bld: 165 mg/dL — ABNORMAL HIGH (ref 70–99)
Potassium: 3.5 mmol/L (ref 3.5–5.1)
Sodium: 142 mmol/L (ref 135–145)
Total Bilirubin: 0.7 mg/dL (ref 0.0–1.2)
Total Protein: 8.3 g/dL — ABNORMAL HIGH (ref 6.5–8.1)

## 2023-12-06 LAB — CBC
HCT: 43.6 % (ref 36.0–46.0)
Hemoglobin: 14 g/dL (ref 12.0–15.0)
MCH: 28.7 pg (ref 26.0–34.0)
MCHC: 32.1 g/dL (ref 30.0–36.0)
MCV: 89.5 fL (ref 80.0–100.0)
Platelets: 221 10*3/uL (ref 150–400)
RBC: 4.87 MIL/uL (ref 3.87–5.11)
RDW: 13.4 % (ref 11.5–15.5)
WBC: 11.1 10*3/uL — ABNORMAL HIGH (ref 4.0–10.5)
nRBC: 0 % (ref 0.0–0.2)

## 2023-12-06 LAB — LIPASE, BLOOD: Lipase: 37 U/L (ref 11–51)

## 2023-12-06 NOTE — ED Triage Notes (Signed)
Pt in with sharp LLQ pain that began at 6pm tonight, has been dealing with constipation a few days and took a Dulcolax 2 days ago. Was able to pass BM yesterday morning and had relief until the sudden onset of low back pain that radiates to LLQ began tonight. Has had multiple episodes of stool since.

## 2023-12-07 ENCOUNTER — Encounter (HOSPITAL_COMMUNITY): Payer: Self-pay

## 2023-12-07 ENCOUNTER — Emergency Department (HOSPITAL_COMMUNITY): Payer: 59

## 2023-12-07 MED ORDER — TAMSULOSIN HCL 0.4 MG PO CAPS: 0.4000 mg | ORAL_CAPSULE | Freq: Every day | ORAL | 0 refills | Status: AC

## 2023-12-07 MED ORDER — ONDANSETRON HCL 4 MG/2ML IJ SOLN
4.0000 mg | Freq: Once | INTRAMUSCULAR | Status: AC
  Administered 2023-12-07: 4 mg via INTRAVENOUS
  Filled 2023-12-07: qty 2

## 2023-12-07 MED ORDER — NAPROXEN 375 MG PO TABS: 375.0000 mg | ORAL_TABLET | Freq: Two times a day (BID) | ORAL | 0 refills | Status: DC

## 2023-12-07 MED ORDER — IOHEXOL 300 MG/ML  SOLN
100.0000 mL | Freq: Once | INTRAMUSCULAR | Status: AC | PRN
  Administered 2023-12-07: 100 mL via INTRAVENOUS

## 2023-12-07 MED ORDER — KETOROLAC TROMETHAMINE 15 MG/ML IJ SOLN
15.0000 mg | Freq: Once | INTRAMUSCULAR | Status: AC
  Administered 2023-12-07: 15 mg via INTRAVENOUS
  Filled 2023-12-07: qty 1

## 2023-12-07 MED ORDER — TAMSULOSIN HCL 0.4 MG PO CAPS
0.4000 mg | ORAL_CAPSULE | Freq: Once | ORAL | Status: AC
  Administered 2023-12-07: 0.4 mg via ORAL
  Filled 2023-12-07: qty 1

## 2023-12-07 MED ORDER — LACTATED RINGERS IV BOLUS
1000.0000 mL | Freq: Once | INTRAVENOUS | Status: AC
  Administered 2023-12-07: 1000 mL via INTRAVENOUS

## 2023-12-07 NOTE — ED Provider Notes (Signed)
[place] EMERGENCY DEPARTMENT AT [facility] Provider Note  CSN: [id number] Arrival date & time: 12/06/23 2142  Chief Complaint(s) Abdominal Pain  HPI Mary Li is a 58 y.o. female with PMH HLD, HTN who presents emergency room for evaluation of left lower quadrant abdominal pain.  States that pain began abruptly at 6 PM on 12/06/2023.  She states that the last week she has been dealing with some constipation and took Dulcolax 2 days ago.  has been able to stool without difficulty since taking the Dulcolax.  No associated nausea or vomiting.  Denies chest pain, shortness of breath, headache, fever or other systemic symptoms.   Past Medical History Past Medical History:  Diagnosis Date   COVID-19    Hypercholesterolemia 06/2012   recommended follow up with primary physician   Hypertension    There are no active problems to display for this patient.  Home Medication(s) Prior to Admission medications   Medication Sig Start Date End Date Taking? Authorizing Provider  naproxen (NAPROSYN) 375 MG tablet Take 1 tablet (375 mg total) by mouth 2 (two) times daily. 12/07/23  Yes Wallice Granville, MD  tamsulosin (FLOMAX) 0.4 MG CAPS capsule Take 1 capsule (0.4 mg total) by mouth daily for 7 days. 12/07/23 12/14/23 Yes Anushri Casalino, MD  amLODipine (NORVASC) 2.5 MG tablet Take 2.5 mg by mouth daily.    [provider]  atorvastatin (LIPITOR) 20 MG tablet Take 20 mg by mouth daily.    [provider]  b complex vitamins capsule one capsule.    [provider]  CALCIUM PO Take 1 tablet by mouth daily.     [provider]  meloxicam (MOBIC) 7.5 MG tablet Take 7.5 mg by mouth 2 (two) times daily as needed. 07/31/22   [provider]  Metformin HCl 500 MG/5ML SOLN Take 500 mg by mouth daily after supper. 06/14/23   [provider]  Omega-3 Fatty Acids (FISH OIL) 1000 MG CAPS Take 1,000 mg by mouth daily.    [provider]   pantoprazole (PROTONIX) 20 MG tablet Take 1 tablet (20 mg total) by mouth daily. 12/20/15   Ward, Chase Picket, PA-C                                                                                                                                    Past Surgical History History reviewed. No pertinent surgical history. Family History Family History  Problem Relation Age of Onset   Hypertension Paternal Grandmother    Stroke Paternal Grandmother    Stroke Mother    Heart attack Father    Diabetes Maternal Grandmother    Cancer Maternal Grandmother        Bladder    Social History Social History   Tobacco Use   Smoking status: Never   Smokeless tobacco: Never  Vaping Use   Vaping status: Never Used  Substance Use Topics  Alcohol use: Yes    Comment: Occas.   Drug use: No   Allergies Patient has no known allergies.  Review of Systems Review of Systems  Gastrointestinal:  Positive for abdominal pain.    Physical Exam Vital Signs  I have reviewed the triage vital signs BP (!) 153/88 (BP Location: Right Arm)   Pulse 92   Temp 99.5 F (37.5 C) (Oral)   Resp 16   Wt 85.7 kg   LMP 08/06/2018   SpO2 96%   BMI 32.43 kg/m   Physical Exam Vitals and nursing note reviewed.  Constitutional:      General: She is not in acute distress.    Appearance: She is well-developed.  HENT:     Head: Normocephalic and atraumatic.  Eyes:     Conjunctiva/sclera: Conjunctivae normal.  Cardiovascular:     Rate and Rhythm: Normal rate and regular rhythm.     Heart sounds: No murmur heard. Pulmonary:     Effort: Pulmonary effort is normal. No respiratory distress.     Breath sounds: Normal breath sounds.  Abdominal:     Palpations: Abdomen is soft.     Tenderness: There is abdominal tenderness in the left lower quadrant.  Musculoskeletal:        General: No swelling.     Cervical back: Neck supple.  Skin:    General: Skin is warm and dry.     Capillary Refill: Capillary refill  takes less than 2 seconds.  Neurological:     Mental Status: She is alert.  Psychiatric:        Mood and Affect: Mood normal.     ED Results and Treatments Labs (all labs ordered are listed, but only abnormal results are displayed) Labs Reviewed  COMPREHENSIVE METABOLIC PANEL - Abnormal; Notable for the following components:      Result Value   Glucose, Bld 165 (*)    Total Protein 8.3 (*)    All other components within normal limits  CBC - Abnormal; Notable for the following components:   WBC 11.1 (*)    All other components within normal limits  URINALYSIS, ROUTINE W REFLEX MICROSCOPIC - Abnormal; Notable for the following components:   APPearance CLOUDY (*)    All other components within normal limits  LIPASE, BLOOD                                                                                                                          Radiology CT ABDOMEN PELVIS W CONTRAST Result Date: 12/07/2023 CLINICAL DATA:  Left lower quadrant pain. EXAM: CT ABDOMEN AND PELVIS WITH CONTRAST TECHNIQUE: Multidetector CT imaging of the abdomen and pelvis was performed using the standard protocol following bolus administration of intravenous contrast. RADIATION DOSE REDUCTION: This exam was performed according to the departmental dose-optimization program which includes automated exposure control, adjustment of the mA and/or kV according to patient size and/or use of iterative reconstruction technique. CONTRAST:  OMNIPAQUE IOHEXOL 300 MG/ML  SOLN COMPARISON:  Right upper quadrant ultrasound 12/20/2015. FINDINGS: Lower chest: There are posterior atelectatic changes in both lungs. There are linear left lateral basal pleurodiaphragmatic opacities which are probably due to chronic scarring. No lung base pneumonia is seen. The cardiac size is normal. There is a small hiatal hernia. Hepatobiliary: The liver is mildly steatotic. There is no mass enhancement. Gallbladder and bile ducts are unremarkable.  Pancreas: No abnormality. Spleen: No abnormality.  No splenomegaly. Adrenals/Urinary Tract: There is no adrenal mass. There are few bilateral subcentimeter too small to characterize Bosniak 2 cysts in both kidneys, and a 1.9 cm Bosniak 1 cyst in the inferior pole on the left, Hounsfield density is 10. No follow-up imaging is recommended. No mass enhancement is seen in either kidney. There is asymmetric left renal cortical contrast retention, left perinephric stranding, and mild left hydroureteronephrosis due to a 2 x 3 mm distal left ureteral stone 1.3 cm from the UVJ. Both ureters are otherwise clear. The bladder is unremarkable for the degree of distention Stomach/Bowel: No dilatation or wall thickening including of the appendix. There is sigmoid diverticulosis without evidence of diverticulitis. Vascular/Lymphatic: Aortic atherosclerosis. No enlarged abdominal or pelvic lymph nodes. Reproductive: Uterus and bilateral adnexa are unremarkable. Other: No abdominal wall hernia or abnormality. No abdominopelvic ascites. Musculoskeletal: There is degenerative change and mild dextroscoliosis of the lumbar spine. No acute or other significant osseous findings. IMPRESSION: 1. 2 x 3 mm distal left ureteral stone with mild left hydroureteronephrosis and perinephric stranding. 2. Diverticulosis without evidence of diverticulitis. 3. Aortic atherosclerosis. 4. Small hiatal hernia. 5. Mild hepatic steatosis. Aortic Atherosclerosis (ICD10-I70.0). Electronically Signed   By: Almira Bar M.D.   On: 12/07/2023 02:44    Pertinent labs & imaging results that were available during my care of the patient were reviewed by me and considered in my medical decision making (see MDM for details).  Medications Ordered in ED Medications  tamsulosin (FLOMAX) capsule 0.4 mg (has no administration in time range)  ketorolac (TORADOL) 15 MG/ML injection 15 mg (15 mg Intravenous Given 12/07/23 0139)  lactated ringers bolus 1,000 mL  (1,000 mLs Intravenous New Bag/Given 12/07/23 0139)  ondansetron (ZOFRAN) injection 4 mg (4 mg Intravenous Given 12/07/23 0220)  iohexol (OMNIPAQUE) 300 MG/ML solution 100 mL (100 mLs Intravenous Contrast Given 12/07/23 0206)                                                                                                                                     Procedures Procedures  (including critical care time)  Medical Decision Making / ED Course   This patient presents to the ED for concern of abdominal pain, this involves an extensive number of treatment options, and is a complaint that carries with it a high risk of complications and morbidity.  The differential diagnosis includes diverticulitis, epiploic appendagitis, colitis, gastroenteritis, constipation, nephrolithiasis, inflammatory bowel disease,   MDM: Patient seen emergency room  for evaluation of abdominal pain.  Physical exam with tenderness in the left lower quadrant but is otherwise unremarkable.  Laboratory evaluation with a mild leukocytosis to 11.1 but is otherwise unremarkable.  Lipase normal, urinalysis unremarkable.  CT abdomen pelvis with a 2 x 3 distal left ureteral stone with mild left hydroureteronephrosis.  Patient pain controlled with Toradol, fluids given and Flomax given.  On reevaluation symptoms have significant improved.  At this time she does not meet inpatient criteria for admission and will be discharged outpatient follow-up.  Flomax sent to patient's pharmacy.   Additional history obtained: -Additional history obtained from husband -External records from outside source obtained and reviewed including: Chart review including previous notes, labs, imaging, consultation notes   Lab Tests: -I ordered, reviewed, and interpreted labs.   The pertinent results include:   Labs Reviewed  COMPREHENSIVE METABOLIC PANEL - Abnormal; Notable for the following components:      Result Value   Glucose, Bld 165 (*)    Total  Protein 8.3 (*)    All other components within normal limits  CBC - Abnormal; Notable for the following components:   WBC 11.1 (*)    All other components within normal limits  URINALYSIS, ROUTINE W REFLEX MICROSCOPIC - Abnormal; Notable for the following components:   APPearance CLOUDY (*)    All other components within normal limits  LIPASE, BLOOD      Imaging Studies ordered: I ordered imaging studies including CTAP I independently visualized and interpreted imaging. I agree with the radiologist interpretation   Medicines ordered and prescription drug management: Meds ordered this encounter  Medications   ketorolac (TORADOL) 15 MG/ML injection 15 mg   lactated ringers bolus 1,000 mL   ondansetron (ZOFRAN) injection 4 mg   iohexol (OMNIPAQUE) 300 MG/ML solution 100 mL   tamsulosin (FLOMAX) capsule 0.4 mg   tamsulosin (FLOMAX) 0.4 MG CAPS capsule    Sig: Take 1 capsule (0.4 mg total) by mouth daily for 7 days.    Dispense:  7 capsule    Refill:  0   naproxen (NAPROSYN) 375 MG tablet    Sig: Take 1 tablet (375 mg total) by mouth 2 (two) times daily.    Dispense:  20 tablet    Refill:  0    -I have reviewed the patients home medicines and have made adjustments as needed  Critical interventions none   Cardiac Monitoring: The patient was maintained on a cardiac monitor.  I personally viewed and interpreted the cardiac monitored which showed an underlying rhythm of: NSR  Social Determinants of Health:  Factors impacting patients care include: none   Reevaluation: After the interventions noted above, I reevaluated the patient and found that they have :improved  Co morbidities that complicate the patient evaluation  Past Medical History:  Diagnosis Date   COVID-19    Hypercholesterolemia 06/2012   recommended follow up with primary physician   Hypertension       Dispostion: I considered admission for this patient, but at this time she does not meet inpatient  criteria for admission and will be discharged with outpatient follow-up.     Final Clinical Impression(s) / ED Diagnoses Final diagnoses:  Kidney stone     @PCDICTATION @    Glendora Score, MD 12/07/23 [phone]

## 2024-08-25 ENCOUNTER — Encounter: Payer: Self-pay | Admitting: Nurse Practitioner

## 2024-08-25 ENCOUNTER — Ambulatory Visit (INDEPENDENT_AMBULATORY_CARE_PROVIDER_SITE_OTHER): Payer: 59 | Admitting: Nurse Practitioner

## 2024-08-25 VITALS — BP 112/80 | HR 70 | Ht 64.25 in | Wt 180.0 lb

## 2024-08-25 DIAGNOSIS — N762 Acute vulvitis: Secondary | ICD-10-CM | POA: Diagnosis not present

## 2024-08-25 DIAGNOSIS — Z780 Asymptomatic menopausal state: Secondary | ICD-10-CM

## 2024-08-25 DIAGNOSIS — Z01419 Encounter for gynecological examination (general) (routine) without abnormal findings: Secondary | ICD-10-CM

## 2024-08-25 DIAGNOSIS — Z1331 Encounter for screening for depression: Secondary | ICD-10-CM

## 2024-08-25 LAB — WET PREP FOR TRICH, YEAST, CLUE

## 2024-08-25 MED ORDER — CLOBETASOL PROPIONATE 0.05 % EX OINT: 1.0000 | TOPICAL_OINTMENT | Freq: Two times a day (BID) | CUTANEOUS | 0 refills | Status: AC

## 2024-08-25 NOTE — Progress Notes (Signed)
 [facility] Nov 07, 1965 [id number]   History:  58 y.o. G0 presents for annual exam. Postmenopausal - no HRT, no bleeding. Normal pap history. HLD, HTN, pre-diabetes managed by PCP.   Gynecologic History Patient's last menstrual period was 08/06/2018.   Contraception/Family planning: post menopausal status Sexually active: Yes  Health Maintenance Last Pap: 08/21/2023. Results were: Normal neg HPV Last mammogram: 12/22/2022. Results were: Normal Last colonoscopy: 03/21/2016. Results were: Normal, 10-year recall Last Dexa: 08/23/2022. Results were: Normal     08/25/2024    9:07 AM  Depression screen PHQ 2/9  Decreased Interest 0  Down, Depressed, Hopeless 0  PHQ - 2 Score 0     Past medical history, past surgical history, family history and social history were all reviewed and documented in the EPIC chart. Married. Social research officer, government for [employer].   ROS:  A ROS was performed and pertinent positives and negatives are included.  Exam:  Vitals:   08/25/24 0857  BP: 112/80  Pulse: 70  SpO2: 97%  Weight: 180 lb (81.6 kg)  Height: 5' 4.25 (1.632 m)      Body mass index is 30.66 kg/m.  General appearance:  Normal Thyroid:  Symmetrical, normal in size, without palpable masses or nodularity. Respiratory  Auscultation:  Clear without wheezing or rhonchi Cardiovascular  Auscultation:  Regular rate, without rubs, murmurs or gallops  Edema/varicosities:  Not grossly evident Abdominal  Soft,nontender, without masses, guarding or rebound.  Liver/spleen:  No organomegaly noted  Hernia:  None appreciated  Skin  Inspection:  Grossly normal Breasts: Examined lying and sitting.   Right: Without masses, retractions, nipple discharge or axillary adenopathy.   Left: Without masses, retractions, nipple discharge or axillary adenopathy. Pelvic: External genitalia: Redness and hypopigmentation extending from clitoral hood to anus              Urethra:  normal appearing urethra with no  masses, tenderness or lesions              Bartholins and Skenes: normal                 Vagina: normal appearing vagina with normal color and discharge, no lesions.               Cervix: no lesions Bimanual Exam:  Uterus:  no masses or tenderness              Adnexa: no mass, fullness, tenderness              Rectovaginal: Deferred              Anus:  normal, no lesions  Mary Li, CMA present as chaperone.   Wet prep negative for pathogens  Assessment/Plan:  58 y.o. G0 for annual exam.   Well female exam with routine gynecological exam - Education provided on SBEs, importance of preventative screenings, current guidelines, high calcium diet, regular exercise, and multivitamin daily.  Labs with PCP.   Postmenopausal - No HRT, no bleeding  Acute vulvitis - Plan: WET PREP FOR TRICH, YEAST, CLUE, clobetasol ointment (TEMOVATE) 0.05 % BID x 7-10 days.   Cervical cancer screening - Normal pap history. Will repeat at 5-year interval per guidelines.  Screening for breast cancer - Normal mammogram history. Overdue and plan sto scheduled soon. Normal breast exam today.  Screening for colon cancer - 2017 colonoscopy. Will repeat at GI's recommended interval.   Screening for osteoporosis - Normal bone density 08/2022.  Return in about 1 year (around 08/25/2025) for  Annual.      Mary DELENA Shutter DNP, 9:36 AM 08/25/2024

## 2025-08-26 ENCOUNTER — Ambulatory Visit: Admitting: Nurse Practitioner
